# Patient Record
Sex: Female | Born: 1960 | Race: White | Hispanic: Yes | Marital: Married | Smoking: Former smoker
Health system: Southern US, Community
[De-identification: ages and names within clinical notes are randomized; demographics above are authoritative.]

## PROBLEM LIST (undated history)

## (undated) DIAGNOSIS — E785 Hyperlipidemia, unspecified: Secondary | ICD-10-CM

## (undated) DIAGNOSIS — E669 Obesity, unspecified: Secondary | ICD-10-CM

## (undated) HISTORY — DX: Obesity, unspecified: E66.9

## (undated) HISTORY — PX: SHOULDER SURGERY: SHX246

## (undated) HISTORY — DX: Hyperlipidemia, unspecified: E78.5

## (undated) HISTORY — PX: TUBAL LIGATION: SHX77

---

## 1998-08-29 ENCOUNTER — Emergency Department (HOSPITAL_COMMUNITY): Admission: EM | Admit: 1998-08-29 | Discharge: 1998-08-29 | Payer: Self-pay | Admitting: Emergency Medicine

## 1998-08-30 ENCOUNTER — Emergency Department (HOSPITAL_COMMUNITY): Admission: EM | Admit: 1998-08-30 | Discharge: 1998-08-30 | Payer: Self-pay | Admitting: Emergency Medicine

## 1998-09-08 ENCOUNTER — Emergency Department (HOSPITAL_COMMUNITY): Admission: EM | Admit: 1998-09-08 | Discharge: 1998-09-08 | Payer: Self-pay | Admitting: Emergency Medicine

## 1999-05-26 ENCOUNTER — Other Ambulatory Visit: Admission: RE | Admit: 1999-05-26 | Discharge: 1999-05-26 | Payer: Self-pay | Admitting: Obstetrics & Gynecology

## 1999-08-01 ENCOUNTER — Ambulatory Visit (HOSPITAL_COMMUNITY): Admission: RE | Admit: 1999-08-01 | Discharge: 1999-08-01 | Payer: Self-pay | Admitting: Obstetrics and Gynecology

## 1999-10-31 ENCOUNTER — Inpatient Hospital Stay (HOSPITAL_COMMUNITY): Admission: AD | Admit: 1999-10-31 | Discharge: 1999-11-01 | Payer: Self-pay | Admitting: *Deleted

## 1999-10-31 ENCOUNTER — Encounter (INDEPENDENT_AMBULATORY_CARE_PROVIDER_SITE_OTHER): Payer: Self-pay

## 2000-05-04 ENCOUNTER — Inpatient Hospital Stay (HOSPITAL_COMMUNITY): Admission: AD | Admit: 2000-05-04 | Discharge: 2000-05-04 | Payer: Self-pay | Admitting: Obstetrics & Gynecology

## 2001-04-06 ENCOUNTER — Emergency Department (HOSPITAL_COMMUNITY): Admission: EM | Admit: 2001-04-06 | Discharge: 2001-04-06 | Payer: Self-pay | Admitting: Emergency Medicine

## 2001-04-06 ENCOUNTER — Encounter: Payer: Self-pay | Admitting: Emergency Medicine

## 2001-06-03 ENCOUNTER — Other Ambulatory Visit: Admission: RE | Admit: 2001-06-03 | Discharge: 2001-06-03 | Payer: Self-pay | Admitting: Obstetrics and Gynecology

## 2001-06-11 ENCOUNTER — Ambulatory Visit (HOSPITAL_COMMUNITY): Admission: RE | Admit: 2001-06-11 | Discharge: 2001-06-11 | Payer: Self-pay | Admitting: Obstetrics and Gynecology

## 2001-06-11 ENCOUNTER — Encounter: Payer: Self-pay | Admitting: Obstetrics and Gynecology

## 2001-07-27 ENCOUNTER — Ambulatory Visit (HOSPITAL_COMMUNITY): Admission: RE | Admit: 2001-07-27 | Discharge: 2001-07-27 | Payer: Self-pay | Admitting: Obstetrics and Gynecology

## 2002-07-24 ENCOUNTER — Encounter: Payer: Self-pay | Admitting: Emergency Medicine

## 2002-07-24 ENCOUNTER — Emergency Department (HOSPITAL_COMMUNITY): Admission: EM | Admit: 2002-07-24 | Discharge: 2002-07-24 | Payer: Self-pay | Admitting: Emergency Medicine

## 2002-08-18 ENCOUNTER — Encounter: Admission: RE | Admit: 2002-08-18 | Discharge: 2002-08-18 | Payer: Self-pay | Admitting: Specialist

## 2002-08-18 ENCOUNTER — Encounter: Payer: Self-pay | Admitting: Specialist

## 2004-09-20 ENCOUNTER — Other Ambulatory Visit: Admission: RE | Admit: 2004-09-20 | Discharge: 2004-09-20 | Payer: Self-pay | Admitting: Obstetrics and Gynecology

## 2004-09-29 ENCOUNTER — Ambulatory Visit (HOSPITAL_COMMUNITY): Admission: RE | Admit: 2004-09-29 | Discharge: 2004-09-29 | Payer: Self-pay | Admitting: *Deleted

## 2005-04-24 ENCOUNTER — Encounter: Admission: RE | Admit: 2005-04-24 | Discharge: 2005-04-24 | Payer: Self-pay | Admitting: Family Medicine

## 2005-06-30 ENCOUNTER — Encounter: Admission: RE | Admit: 2005-06-30 | Discharge: 2005-06-30 | Payer: Self-pay | Admitting: Family Medicine

## 2005-11-09 ENCOUNTER — Ambulatory Visit: Payer: Self-pay | Admitting: Family Medicine

## 2006-05-04 ENCOUNTER — Ambulatory Visit: Payer: Self-pay | Admitting: Family Medicine

## 2006-07-19 ENCOUNTER — Ambulatory Visit: Payer: Self-pay | Admitting: Family Medicine

## 2006-07-23 ENCOUNTER — Ambulatory Visit: Payer: Self-pay | Admitting: Family Medicine

## 2006-09-05 ENCOUNTER — Emergency Department (HOSPITAL_COMMUNITY): Admission: EM | Admit: 2006-09-05 | Discharge: 2006-09-05 | Payer: Self-pay | Admitting: Emergency Medicine

## 2007-01-28 ENCOUNTER — Ambulatory Visit: Payer: Self-pay | Admitting: Family Medicine

## 2007-04-19 ENCOUNTER — Encounter: Admission: RE | Admit: 2007-04-19 | Discharge: 2007-04-19 | Payer: Self-pay | Admitting: Anesthesiology

## 2007-07-29 ENCOUNTER — Ambulatory Visit: Payer: Self-pay | Admitting: Family Medicine

## 2007-08-06 ENCOUNTER — Emergency Department (HOSPITAL_COMMUNITY): Admission: EM | Admit: 2007-08-06 | Discharge: 2007-08-06 | Payer: Self-pay | Admitting: Emergency Medicine

## 2007-08-07 ENCOUNTER — Emergency Department (HOSPITAL_COMMUNITY): Admission: EM | Admit: 2007-08-07 | Discharge: 2007-08-07 | Payer: Self-pay | Admitting: Emergency Medicine

## 2007-08-13 ENCOUNTER — Ambulatory Visit: Payer: Self-pay | Admitting: Family Medicine

## 2007-09-12 ENCOUNTER — Ambulatory Visit (HOSPITAL_BASED_OUTPATIENT_CLINIC_OR_DEPARTMENT_OTHER): Admission: RE | Admit: 2007-09-12 | Discharge: 2007-09-13 | Payer: Self-pay | Admitting: Orthopedic Surgery

## 2008-02-20 ENCOUNTER — Ambulatory Visit: Payer: Self-pay | Admitting: Family Medicine

## 2008-03-23 ENCOUNTER — Ambulatory Visit: Payer: Self-pay | Admitting: Family Medicine

## 2008-06-01 ENCOUNTER — Ambulatory Visit: Payer: Self-pay | Admitting: Family Medicine

## 2008-06-02 ENCOUNTER — Encounter: Admission: RE | Admit: 2008-06-02 | Discharge: 2008-06-02 | Payer: Self-pay | Admitting: Family Medicine

## 2009-02-16 ENCOUNTER — Ambulatory Visit: Payer: Self-pay | Admitting: Family Medicine

## 2009-02-18 ENCOUNTER — Encounter: Payer: Self-pay | Admitting: Internal Medicine

## 2009-03-01 ENCOUNTER — Other Ambulatory Visit: Admission: RE | Admit: 2009-03-01 | Discharge: 2009-03-01 | Payer: Self-pay | Admitting: Family Medicine

## 2009-03-01 ENCOUNTER — Ambulatory Visit: Payer: Self-pay | Admitting: Family Medicine

## 2009-03-15 ENCOUNTER — Ambulatory Visit: Payer: Self-pay | Admitting: Internal Medicine

## 2009-03-15 DIAGNOSIS — I498 Other specified cardiac arrhythmias: Secondary | ICD-10-CM | POA: Insufficient documentation

## 2009-03-15 DIAGNOSIS — R079 Chest pain, unspecified: Secondary | ICD-10-CM

## 2009-03-15 DIAGNOSIS — E7800 Pure hypercholesterolemia, unspecified: Secondary | ICD-10-CM | POA: Insufficient documentation

## 2009-03-15 DIAGNOSIS — E663 Overweight: Secondary | ICD-10-CM | POA: Insufficient documentation

## 2009-03-15 HISTORY — DX: Chest pain, unspecified: R07.9

## 2009-03-15 HISTORY — DX: Overweight: E66.3

## 2009-03-15 HISTORY — DX: Pure hypercholesterolemia, unspecified: E78.00

## 2009-03-18 ENCOUNTER — Telehealth (INDEPENDENT_AMBULATORY_CARE_PROVIDER_SITE_OTHER): Payer: Self-pay

## 2009-03-19 ENCOUNTER — Encounter: Payer: Self-pay | Admitting: Internal Medicine

## 2009-03-19 ENCOUNTER — Ambulatory Visit: Payer: Self-pay

## 2009-03-22 ENCOUNTER — Ambulatory Visit: Payer: Self-pay | Admitting: Cardiology

## 2009-03-22 ENCOUNTER — Encounter (HOSPITAL_COMMUNITY): Admission: RE | Admit: 2009-03-22 | Discharge: 2009-05-12 | Payer: Self-pay | Admitting: Internal Medicine

## 2009-03-22 ENCOUNTER — Ambulatory Visit: Payer: Self-pay

## 2009-04-12 ENCOUNTER — Encounter
Admission: RE | Admit: 2009-04-12 | Discharge: 2009-04-12 | Payer: Self-pay | Admitting: Physical Medicine and Rehabilitation

## 2009-04-14 ENCOUNTER — Telehealth: Payer: Self-pay | Admitting: Internal Medicine

## 2009-04-19 ENCOUNTER — Ambulatory Visit: Payer: Self-pay | Admitting: Family Medicine

## 2009-04-26 ENCOUNTER — Telehealth (INDEPENDENT_AMBULATORY_CARE_PROVIDER_SITE_OTHER): Payer: Self-pay | Admitting: *Deleted

## 2009-08-19 ENCOUNTER — Telehealth (INDEPENDENT_AMBULATORY_CARE_PROVIDER_SITE_OTHER): Payer: Self-pay | Admitting: *Deleted

## 2009-08-19 ENCOUNTER — Ambulatory Visit: Payer: Self-pay | Admitting: Family Medicine

## 2009-08-20 ENCOUNTER — Ambulatory Visit: Payer: Self-pay | Admitting: Family Medicine

## 2010-04-28 ENCOUNTER — Ambulatory Visit: Payer: Self-pay | Admitting: Family Medicine

## 2010-06-05 ENCOUNTER — Encounter: Payer: Self-pay | Admitting: Family Medicine

## 2010-06-14 NOTE — Progress Notes (Signed)
Summary: Records request from Raphael Gibney. Cathey Endow at [employer] for records received from [employer]. Tresea Mall, Pensions consultant at [employer]. Request forwarded to [employer]. Dena Chavis  August 19, 2009 1:26 PM  Appended Document: Records request from Raphael Gibney. Cathey Endow at [employer] brought paper back to meJonny Ruiz S. Iorio Attorney @ Law)  Dr.McLean is not to sign, I will forward to [place] to see if he wil sign.

## 2010-09-27 NOTE — Op Note (Signed)
Traci Young, Traci Young       ACCOUNT NO.:  [id number]   MEDICAL RECORD NO.:  [id number]          PATIENT TYPE:  AMB   LOCATION:  DSC                          FACILITY:  [facility]   PHYSICIAN:  Katy Fitch. Sypher, M.D. 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  Also retracted supraspinatus and  infraspinatus rotator cuff tear.   OPERATION:  1. Diagnostic arthroscopy, right shoulder followed by arthroscopic      debridement of labrum and synovitis and partial debridement of      subscapularis degenerative tear grade 1.  2. Arthroscopic subacromial decompression with decompression of os      acromionale and removal of osteophytes at nonunion of os      acromionale with partial debridement of distal clavicle preserving      the capsule of the [facility] joint posteriorly, superiorly, and      anteriorly.  3. Anterior-inferior distal clavicle resection.  4. Arthroscopic reconstruction of supraspinatus and infraspinatus      rotator cuff tears.   SURGEON:  Katy Fitch. Sypher, MD   ASSISTANT:  Marveen Reeks. Dasnoit, PA-C.   ANESTHESIA:  General by endotracheal technique supplemented by a right  interscalene block.   SUPERVISING  ANESTHESIOLOGIST:  Quita Skye. Krista Blue, MD   INDICATIONS:  Traci Young is a 46-year woman referred through  the courtesy of Dr. Thyra Breed for evaluation and management of a  right rotator cuff tear.   Clinical examination revealed an os acromionale, AC degenerative change,  and weakness of abduction and external rotation consistent with a  rotator cuff tear.   An MRI of the shoulder documenting the unstable os acromionale, a  chronic retracted tear of the supraspinatus, infraspinatus tendons, and  some degenerative change at the [facility] joint with a prominent anterior-  inferior osteophyte.   Through an interpreter with detailed informed consent, we advised [name] to proceed with arthroscopic evaluation of her shoulder  anticipating decompression of the os acromionale and decompression of  the distal clavicle followed by repair of the rotator cuff with either  arthroscopic or open technique.  After informed consent, she is brought  to the operating room at this time.   PROCEDURE IN DETAIL:  Traci Young was brought to the operating  room and placed in supine position on the operating table.   Following the induction of general anesthesia by endotracheal technique,  she was carefully positioned in a beach-chair position with the aid of a  torso and head holder designed for  shoulder arthroscopy.   The entire right upper extremity and forequarter prepped with DuraPrep  and draped with impervious arthroscopy drapes.   The procedure commenced with placement of the arthroscope through a  standard posterior viewing portal.  Diagnostic arthroscopy revealed a  superior grade 1 subscapularis tear and moderate synovitis.  There was  minimal degenerative changes in the labrum.  The long head biceps had a  stable origin at the superior labrum and the biceps tendon was normal  through the rotator interval.  The entire supraspinatus and  infraspinatus tendons had  avulsed with a greater tuberosity and  recovered by bursa.  The greater tuberosity had reactive bone formation  with a impingement osteophyte.  The teres minor was intact.  The  inferior capsule was otherwise unremarkable and the anterior capsular  ligaments were otherwise unremarkable.   With a spinal needle, the site for debridement of the greater tuberosity  was identified followed by use of the suction shaver debriding the bursa  and a power bur decorticated in the greater tuberosity from the biceps  tendon anteriorly across to the insertion of the teres minor.   The scope was then placed in the subacromial space confirming the nature  of the rotator cuff tear, followed by debridement of the bursa and the  capsule overlying the nonunion of the os acromionale.   The electrocautery was used to release bursa followed by leveling of the  acromion to a type 1 morphology by removal of the osteophytes at the os  acromionale nonunion and generous anterior-medial and anterior-lateral  acromioplasty.  The capsule of the [facility] joint was taken down anteriorly to  allow removal of an anterior-inferior osteophyte at the distal clavicle.  Approximately 25% of the inferior distal clavicle was resected.   After hemostasis was achieved, the tear was carefully examined.  With  the scope placed both in the bursa and in the joint, we placed 2 medial  anchors directly at the articular margin.  With the aid of the standard  Arthrex Scorpion suture passer, a total of 4 mattress sutures were  placed medially to create a proper foot print at the joint margin.  A  technical difficulty was encountered with the Scorpion which was causing  repetitive maceration of the sutures.  Therefore, that particular  Scorpion was rejected and a new one obtained.   Ultimately, satisfactory sutures were placed followed by abduction of  the shoulder inset of the tendon medially, followed by repair with over-  the-top swivel  lock sutures insetting an anatomic repair.   The profile repair was checked both in the bursa and within the joint.  No residual PASTA lesion was noted.   The subscapularis tendon was debrided and found to have minimal grade 1  changes, not requiring repair.   After hemostasis was achieved, the scope was removed from the  subacromial space and the portals repaired with mattress suture of 3-0  nylon.  There were no apparent complications.   Traci Young was awakened from general anesthesia and transferred  to the recovery room with stable signs.  She will be admitted to  recovery care center for observation of vital signs and analgesics in  the form of IV PCA morphine and p.o. and IV Dilaudid as needed.      Katy Fitch Sypher, M.D.  Electronically Signed     RVS/MEDQ  D:  09/12/2007  T:  09/13/2007  Job:  [id number]   cc:   Loraine Leriche L. Vear Clock, M.D.

## 2010-09-30 NOTE — Op Note (Signed)
[facility]  Patient:    Traci Young                       MRN: [id number] Proc. Date: 10/31/99 Adm. Date:  [id number] Disc. Date: [id number] Attending:  Cleatrice Burke Dictator:   Erin Sons, C.N.M.                           Operative Report  PREOPERATIVE DIAGNOSIS:       Intrauterine fetal demise at term.  Meconium stained fluid.  POSTOPERATIVE DIAGNOSIS:      Intrauterine fetal demise at term.  Meconium stained fluid.  OPERATION:                    Normal spontaneous vaginal delivery over an intact perineum.  FINDINGS:                     Nonviable female infant, weight _________, Apgars 0 and 0.  SURGEON:                      Cecilio Asper, M.D.  ASSISTANT:  ANESTHESIA:                   Epidural.  ESTIMATED BLOOD LOSS:  INDICATIONS:                  [name] is a 50 year old, gravida 4, para 3-0-0-3, who was admitted today at 39-5/7 weeks with the diagnosis of intrauterine fetal  demise.  Artificial rupture of membranes was accomplished by Cecilio Asper, M.D. with meconium stained fluid noted.  Pitocin augmentation was begun.  Epidural anesthesia was placed.  DESCRIPTION OF PROCEDURE:     The patient progressed rapidly to completely dilated at 6 p.m. and at 6:22 p.m. delivered precipitously a nonviable female infant. There were no obvious anomalies noted.  There was no evidence of cord entanglements. There was a three vessel cord.  There was minimal skin sloughing and minimal overlapping sutures.  The placenta was spontaneous and intact.  In its delivery, it was meconium stained.  It was sent to pathology.  There was a small vaginal abrasion noted.  No bleeding was noted and no suturing was required.  The fundus was firm.  Estimated blood loss was approximately 100 cc.  Support was offered o the patient and her family through the registered nurse interpretor.  Cecilio Asper, M.D. was en  route for delivery at the time the patient accomplished her birth.  Comfort components of care were implemented by the nursing staff. DD:  10/31/99 TD:  11/02/99 Job: [id number]

## 2010-09-30 NOTE — H&P (Signed)
[facility]  Patient:    Traci Young                       MRN: [id number] Adm. Date:  [id number] Attending:  Cleatrice Burke Dictator:   Erin Sons, C.N.M.                         History and Physical  DATE OF BIRTH:                January 06, 1961  HISTORY OF PRESENT ILLNESS:   Ms. Traci Young is a 50 year old, gravida 4, para 3-0-0-3 at 39-5/7 weeks who presents from the office with a diagnosed IUFD at term.  The patient reports that she has not felt fetal movement since following her last office visit on Thursday of last week.  Pregnancy has remarkable for:                               1. Positive group B strep.                               2. Language barrier.  PRENATAL LABORATORY DATA:     To be dictated separately.  OBSTETRICAL HISTORY:          In 1986, the patient had a vaginal birth of a female infant that weighed 8 pounds at [redacted] weeks gestation.  She was in labor 3 hours.  That child was born in [place].  In 1989 she had a vaginal birth of a female infant that weighed 8 pounds at [redacted] weeks gestation.  She was in labor approximately 1 hours.  She had no complications.  That child was also born in [place].  In 1995 she had a vaginal birth of a female infant, weight 9 pounds at [redacted] weeks gestation.  She was in labor 2 hours and she had no complications.  That child was also born in [place].  PAST MEDICAL HISTORY:         She reports the usual childhood illnesses.  She had a history of some superficial varicosities.  She had a UTI in 1999.  At age 87 or 8 she had a tonsillectomy.  Her only other hospitalization was for childbirth.  HISTORY OF PRESENT PREGNANCY:                    The patient entered care at approximately 16 weeks.  She had an ultrasound at 26 weeks for size greater than dates which showed normal findings.  She had a normal Glucola.  Hemoglobin was 11.8 at 28 weeks.  She had an elevated one-hour GTT.  She had a followup  three-hour GTT that was normal.  FAMILY HISTORY:               Her brother and sister have had heart attacks. Her mother has a heart problem.  Her son has asthma.  Her mother has insulin-dependent diabetes.  Her father and brother are non-insulin dependent diabetics.  Her sister has thyroid problems.  GENETIC HISTORY:              Remarkable for patient being 36 at the time of delivery, the father of the baby being 72 at delivery.  SOCIAL HISTORY:  The patient is married to the father of the baby.  He is involved and supportive.  His name is Kirtland Bouchard.  The patient is Hispanic, from [place].  She is of the [employer].  She as a 9th grade education.  She is unemployed.  Her husband also has a 9th grade education and is employed in distribution services.  She has been followed by the physicians service of [facility].  She denies any alcohol, drug or tobacco use during this pregnancy.   PHYSICAL EXAMINATION:  VITAL SIGNS:                  Vital signs are stable.  The patient is afebrile.  HEENT:                        Within normal limits.  LUNGS:                        Bilateral breath sounds are clear.  HEART:                        Regular rate and rhythm without murmur.  BREASTS:                      Soft and nontender.  ABDOMEN:                      Fundal height is approximately 38 cm.  Estimated fetal weight is 8 to 8-1/2 pounds.  Uterine contractions are irregular and mild.  PELVIC:                       Cervical exam is deferred at present. Ultrasound confirmed IUFD at term.  EXTREMITIES:                  Deep tendon reflexes are 2+ without clonus. There is trace edema noted.  IMPRESSION:                   1. Intrauterine pregnancy with an intrauterine                                  fetal demise at 39-5/7 weeks.                               2. Multiparous.                               3. Positive group B streptococcus.  PLAN:                          1. Admit to birthing suite for                                  consult with Dr. Cleatrice Burke                                  who is attending physician.  2. Routine physician orders.                               3. Dr. Kathryne Sharper will review with the                                  patient and interpreter, plan of care for                                  induction of labor. DD:  10/31/99 TD:  10/31/99 Job: [id number]

## 2010-09-30 NOTE — Discharge Summary (Signed)
[facility]  Patient:    Traci Young                       MRN: [id number] Adm. Date:  [id number] Disc. Date: [id number] Attending:  Cleatrice Burke Dictator:   Miguel Dibble, C.N.M.                           Discharge Summary  DATE OF BIRTH:                15-Jul-1960  PROCEDURES:                   1. Spontaneous vaginal delivery of a nonviable                                  infant.                               2. Pitocin induction of labor.  HOSPITAL COURSE:              On October 31, 1999, the patient presented for routine office visit, stating that she had noticed no fetal movement over the previous week.  She was found to have an intrauterine fetal death.  She is Spanish speaking, and it was explained to her in Spanish that an induction would be required.  The patient and father of the baby desire an autopsy after delivery and prior to funeral services.  An induction was initiated.  She was admitted to the birthing suite at 3 cm, 50% effaced, vertex at -1 to -2. Rupture membranes revealed meconium stained fluid.  By 3244, the patient was breathing through contractions, which were every 4-5 minutes.  Her cervix was 5 cm and 90% effaced with 3 mu of Pitocin infusion.  She received an epidural for pain relief and continued to relax and dilate satisfactorily so that, by 1822, she was able to have a normal spontaneous vaginal delivery over an intact perineum under epidural anesthesia of a female weighing 7 pounds 9 ounces and APGAR of 0 and 0.  Following delivery, there were no apparent signs of anomalies or obvious masses or reasons lending etiology to the cause of death.  On 07-08-2024postpartum day #1, vital signs were satisfactory. Hemoglobin had dropped from 15.9 to 12.5, platelets 272.  The patient was tearful and appropriately grieving.  She received emotional support through a Spanish interpreter and was referred to the comfort team.   She also signed her autopsy papers to allow the autopsy to occur prior to the babys body being transferred to [facility].  She was able to understand her instructions in Spanish and the use of her discharge medications.  She decided on intrauterine device for contraception, that she would return in six weeks to have it placed.  DISCHARGE INSTRUCTIONS:       Per [facility] booklet.  DISCHARGE MEDICATIONS:        Tylenol #3, Motrin, prenatal vitamins.  FOLLOW UP PLAN:               In six weeks at [facility]. DD:  Nov 20, 1999 TD:  11/02/99 Job: [id number]

## 2010-09-30 NOTE — Consult Note (Signed)
[facility]  Patient:    Traci Young, Traci Young                 MRN: [id number] Adm. Date:  [id number] Disc. Date: [id number] Attending:  Leonard Schwartz                          Consultation Report  HISTORY OF PRESENT ILLNESS:   The patient is a 50 year old female, gravida 4, para 4-0-0-3, who presents to the [facility] complaining of bleeding and pain.  She had a Jearld Adjutant IUD put in in November 2001.  She denies fever or chills.  The patient does not speak English, and an interpreter was used to help perform this history and physical examination. The patient denies any prior history of surgery.  She denies any prior history of sexually transmitted diseases.  PAST OBSTETRIC HISTORY:       The patient has had four term deliveries, but one still birth.  DRUG ALLERGIES:               No known drug allergies.  PAST MEDICAL HISTORY:         Contributory.  PHYSICAL EXAMINATION:  VITAL SIGNS:                  Temperature 97.1, pulse 76, respirations 20, blood pressure 111/65.  CHEST:                        Clear.  HEART:                        Regular rate and rhythm.  ABDOMEN:                      Nontender.  PELVIC:                       External genitalia normal.  The vagina is normal except for a small amount of blood.  The cervix is nontender.  The IUD string is present.  The uterus is normal size, shape and consistency. Adnexa show no masses.  LABORATORY DATA:              Urinalysis negative.  Urine pregnancy test is negative.  White blood cell count 7300.  Platelet count 257,000.  Hemoglobin 13.4, hematocrit 37.6%.  Ultrasound performed by Dr. Stefano Gaul showed a normal sized uterus with an IUD present within the uterine cavity.  No adnexal masses are appreciated.  No abnormal fluid collections present.  ASSESSMENT:                   Vaginal spotting and slight pain associated with an IUD.  No signs of infection or  pathology.  PLAN:                         The patient was told to take ibuprofen 600 mg q.6h. p.r.n.  She will call if she is having any other difficulties.  A translator was used and the information was understood according to the patient. DD:  05/04/00 TD:  05/06/00 Job: [id number]

## 2010-10-14 ENCOUNTER — Ambulatory Visit (INDEPENDENT_AMBULATORY_CARE_PROVIDER_SITE_OTHER): Payer: BC Managed Care – PPO | Admitting: Family Medicine

## 2010-10-14 ENCOUNTER — Encounter: Payer: Self-pay | Admitting: Family Medicine

## 2010-10-14 VITALS — BP 130/82 | HR 88 | Temp 98.4°F | Ht 64.0 in | Wt 275.0 lb

## 2010-10-14 DIAGNOSIS — H6690 Otitis media, unspecified, unspecified ear: Secondary | ICD-10-CM

## 2010-10-14 DIAGNOSIS — H6692 Otitis media, unspecified, left ear: Secondary | ICD-10-CM

## 2010-10-14 DIAGNOSIS — J40 Bronchitis, not specified as acute or chronic: Secondary | ICD-10-CM

## 2010-10-14 MED ORDER — AMOXICILLIN 875 MG PO TABS: 875.0000 mg | ORAL_TABLET | Freq: Two times a day (BID) | ORAL | Status: AC

## 2010-10-14 NOTE — Progress Notes (Signed)
  Subjective:    Patient ID: Donald Prose, female    DOB: [birth year], 50 y.o.   MRN: [id number]  HPI She has a five-day history of sore throat, nasal congestion, cough, malaise, some fever and chills, rhinorrhea. She has no allergies and does not smoke    Review of Systems     Objective:   Physical Examalert and in no distress. The left tympanic membrane is slightly dull, right is normal, canals are normal. Throat is clear. Tonsils are normal. Neck is supple without adenopathy or thyromegaly. Cardiac exam shows a regular sinus rhythm without murmurs or gallops. Lungs are clear to auscultation.        Assessment & Plan:  LOM. Bronchitis Treatment with Amoxil. Also recommend NyQuil at night. She will call if not entirely better.

## 2010-10-14 NOTE — Patient Instructions (Addendum)
Take all the antibiotic. use Claritin for the runny nose. use NyQuil for the coughing at night. If you're not totally back to normal when you finish the antibiotics call me

## 2010-11-11 ENCOUNTER — Ambulatory Visit: Payer: BC Managed Care – PPO | Admitting: Medical

## 2010-11-14 ENCOUNTER — Encounter: Payer: Self-pay | Admitting: Family Medicine

## 2010-12-19 ENCOUNTER — Encounter: Payer: Self-pay | Admitting: Family Medicine

## 2010-12-19 ENCOUNTER — Ambulatory Visit (INDEPENDENT_AMBULATORY_CARE_PROVIDER_SITE_OTHER): Payer: BC Managed Care – PPO | Admitting: Family Medicine

## 2010-12-19 VITALS — BP 120/78 | HR 76 | Temp 98.3°F | Wt 179.0 lb

## 2010-12-19 DIAGNOSIS — H8110 Benign paroxysmal vertigo, unspecified ear: Secondary | ICD-10-CM

## 2010-12-19 DIAGNOSIS — M25519 Pain in unspecified shoulder: Secondary | ICD-10-CM

## 2010-12-19 DIAGNOSIS — M25511 Pain in right shoulder: Secondary | ICD-10-CM

## 2010-12-19 MED ORDER — MECLIZINE HCL 12.5 MG PO TABS: 12.5000 mg | ORAL_TABLET | Freq: Three times a day (TID) | ORAL | Status: DC | PRN

## 2010-12-19 NOTE — Patient Instructions (Signed)
Use the medicine for the dizziness as needed. If you're still having difficulty in one week, return here. You may have the surgery done in [facility] or with a local physician; whichever you prefer

## 2010-12-19 NOTE — Progress Notes (Signed)
  Subjective:    Patient ID: Traci Young, female    DOB: [birth year], 50 y.o.   MRN: [id number]  HPI She complains of a several day history of dizziness with any position but no blurred vision, double vision is, sinus congestion or PND. No heart rate changes. She also continues to have difficulty with right shoulder pain. It was difficult to get a good history but apparently she has had surgery and has had 3 consultations. She did have an MRI done and surgery was recommended.   Review of Systems     Objective:   Physical Exam alert and in no distress. Tympanic membranes and canals are normal. Throat is clear. Tonsils are normal. Neck is supple without adenopathy or thyromegaly. Cardiac exam shows a regular sinus rhythm without murmurs or gallops. Lungs are clear to auscultation. EOMI.        Assessment & Plan:  Benign positional vertigo. Right shoulder pain. I will treat with Antivert. If continued difficulty they're to return here in one week. Also recommended that she go back to the surgeon she saw in [place] since he is one of did the MRI and potentially have surgery done there as opposed to seeing another Careers adviser.

## 2011-03-14 ENCOUNTER — Ambulatory Visit (INDEPENDENT_AMBULATORY_CARE_PROVIDER_SITE_OTHER): Payer: BC Managed Care – PPO | Admitting: Family Medicine

## 2011-03-14 ENCOUNTER — Encounter: Payer: Self-pay | Admitting: Family Medicine

## 2011-03-14 VITALS — BP 130/80 | HR 66 | Temp 97.8°F | Wt 180.0 lb

## 2011-03-14 DIAGNOSIS — R430 Anosmia: Secondary | ICD-10-CM

## 2011-03-14 DIAGNOSIS — R439 Unspecified disturbances of smell and taste: Secondary | ICD-10-CM

## 2011-03-14 DIAGNOSIS — Z23 Encounter for immunization: Secondary | ICD-10-CM

## 2011-03-14 DIAGNOSIS — J019 Acute sinusitis, unspecified: Secondary | ICD-10-CM

## 2011-03-14 MED ORDER — AMOXICILLIN 875 MG PO TABS: 875.0000 mg | ORAL_TABLET | Freq: Two times a day (BID) | ORAL | Status: AC

## 2011-03-14 NOTE — Progress Notes (Signed)
  Subjective:    Patient ID: Traci Young, female    DOB: [birth year], 50 y.o.   MRN: [id number]  HPI She is here for evaluation of difficulty with lack of smell and taste in her mouth for the last several months. She was treated approximately 2 or 3 months ago for sinusitis did help with her symptoms. No cough, rhinorrhea, sore throat, fever or chills. She is a previous smoker   Review of Systems     Objective:   Physical Exam alert and in no distress. Tympanic membranes and canals are normal. Throat is clear. Tonsils are normal. Neck is supple without adenopathy or thyromegaly. Cardiac exam shows a regular sinus rhythm without murmurs or gallops. Lungs are clear to auscultation. Nasal mucosa is red and swollen with tenderness over all sinuses       Assessment & Plan:  Sinusitis. Anosmia. I will treat her with Amoxil. She is to call me if not entirely better Flu shot given

## 2011-03-14 NOTE — Patient Instructions (Signed)
Take all the antibiotic and if not totally back to normal ,call me.

## 2011-08-14 ENCOUNTER — Encounter: Payer: Self-pay | Admitting: Family Medicine

## 2011-08-14 ENCOUNTER — Ambulatory Visit (INDEPENDENT_AMBULATORY_CARE_PROVIDER_SITE_OTHER): Payer: BC Managed Care – PPO | Admitting: Family Medicine

## 2011-08-14 VITALS — BP 120/70 | HR 65 | Wt 182.0 lb

## 2011-08-14 DIAGNOSIS — J019 Acute sinusitis, unspecified: Secondary | ICD-10-CM

## 2011-08-14 MED ORDER — AMOXICILLIN-POT CLAVULANATE 875-125 MG PO TABS: 1.0000 | ORAL_TABLET | Freq: Two times a day (BID) | ORAL | Status: AC

## 2011-08-14 NOTE — Progress Notes (Signed)
  Subjective:    Patient ID: Traci Young, female    DOB: May 30, 1960, 51 y.o.   MRN: [id number]  HPI She complains of a several month history of having to clear her throat as well as having nasal congestion. Her son is here as a Nurse, learning disability. She has no fever, chills, earache. She did smoke but quit 15 years ago.   Review of Systems     Objective:   Physical Exam alert and in no distress. Tympanic membranes and canals are normal. Throat is clear. Tonsils are normal. Neck is supple without adenopathy or thyromegaly. Cardiac exam shows a regular sinus rhythm without murmurs or gallops. Lungs are clear to auscultation. Nasal mucosa is red. Questionable tenderness over her sinuses.       Assessment & Plan:   1. Sinusitis acute  amoxicillin-clavulanate (AUGMENTIN) 875-125 MG per tablet   she is to call me in 2 weeks to let him know how she is feeling.

## 2011-08-14 NOTE — Patient Instructions (Signed)
Take all of the antibiotic and call me in 2 weeks to let him know how you're doing

## 2012-07-12 ENCOUNTER — Emergency Department (HOSPITAL_COMMUNITY): Payer: 59

## 2012-07-12 ENCOUNTER — Emergency Department (HOSPITAL_COMMUNITY)
Admission: EM | Admit: 2012-07-12 | Discharge: 2012-07-12 | Disposition: A | Discharge status: Home / Self Care | Payer: 59 | Attending: Emergency Medicine | Admitting: Emergency Medicine

## 2012-07-12 ENCOUNTER — Encounter (HOSPITAL_COMMUNITY): Payer: Self-pay

## 2012-07-12 DIAGNOSIS — E669 Obesity, unspecified: Secondary | ICD-10-CM | POA: Insufficient documentation

## 2012-07-12 DIAGNOSIS — Y9289 Other specified places as the place of occurrence of the external cause: Secondary | ICD-10-CM | POA: Insufficient documentation

## 2012-07-12 DIAGNOSIS — Y9302 Activity, running: Secondary | ICD-10-CM | POA: Insufficient documentation

## 2012-07-12 DIAGNOSIS — Z87891 Personal history of nicotine dependence: Secondary | ICD-10-CM | POA: Insufficient documentation

## 2012-07-12 DIAGNOSIS — S93409A Sprain of unspecified ligament of unspecified ankle, initial encounter: Secondary | ICD-10-CM | POA: Insufficient documentation

## 2012-07-12 DIAGNOSIS — Z862 Personal history of diseases of the blood and blood-forming organs and certain disorders involving the immune mechanism: Secondary | ICD-10-CM | POA: Insufficient documentation

## 2012-07-12 DIAGNOSIS — S93402A Sprain of unspecified ligament of left ankle, initial encounter: Secondary | ICD-10-CM

## 2012-07-12 DIAGNOSIS — Z8639 Personal history of other endocrine, nutritional and metabolic disease: Secondary | ICD-10-CM | POA: Insufficient documentation

## 2012-07-12 DIAGNOSIS — X500XXA Overexertion from strenuous movement or load, initial encounter: Secondary | ICD-10-CM | POA: Insufficient documentation

## 2012-07-12 MED ORDER — OXYCODONE-ACETAMINOPHEN 5-325 MG PO TABS: 2.0000 | ORAL_TABLET | ORAL | Status: DC | PRN

## 2012-07-12 MED ORDER — OXYCODONE-ACETAMINOPHEN 5-325 MG PO TABS
2.0000 | ORAL_TABLET | Freq: Once | ORAL | Status: AC
  Administered 2012-07-12: 2 via ORAL
  Filled 2012-07-12: qty 2

## 2012-07-12 NOTE — Progress Notes (Signed)
Orthopedic Tech Progress Note Patient Details:  Traci Young 08/16/1960 [id number]  Ortho Devices Type of Ortho Device: ASO;Crutches Ortho Device/Splint Location: LEFT ASO AND CRUTCHES Ortho Device/Splint Interventions: Application   [name] 07/12/2012, 11:28 AM

## 2012-07-12 NOTE — ED Notes (Signed)
Pt moved to stretcher, foot elevated.

## 2012-07-12 NOTE — ED Provider Notes (Signed)
History     CSN: [id number]  Arrival date & time 07/12/12  0932   First MD Initiated Contact with Patient 07/12/12 1001      Chief Complaint  Patient presents with  . Ankle Pain    (Consider location/radiation/quality/duration/timing/severity/associated sxs/prior treatment) HPI Comments: Traci Young is a 52 y.o. female who presents for nonradiating L ankle pain x 2 days. Patient was running [place] some trails in the woods yesterday for exercise when she twisted her ankle. Patient admits to weight bearing x 2 days, but this makes the pain in her ankle worse. Pain lessens when not weight bearing. Patient admits to ankle swelling and denies LOC, numbness/tingling in LLE and redness or warmth of the affected area. Denies fevers.  Patient is a 52 y.o. female presenting with ankle pain. The history is provided by the patient. A language interpreter was used (Son translated).  Ankle Pain Location:  Ankle Time since incident:  2 days Ankle location:  L ankle Pain details:    Quality:  Aching   Radiates to:  Does not radiate   Severity:  Mild   Progression:  Unchanged Chronicity:  New Relieved by:  Immobilization and rest Worsened by:  Bearing weight Associated symptoms: swelling   Associated symptoms: no back pain, no fever, no muscle weakness, no numbness and no tingling     Past Medical History  Diagnosis Date  . Obesity   . Elevated lipids     History reviewed. No pertinent past surgical history.  History reviewed. No pertinent family history.  History  Substance Use Topics  . Smoking status: Former Games developer  . Smokeless tobacco: Never Used  . Alcohol Use: No    OB History   Grav Para Term Preterm Abortions TAB SAB Ect Mult Living                  Review of Systems  Constitutional: Negative for fever.  Respiratory: Negative for shortness of breath.   Cardiovascular: Negative for chest pain.  Musculoskeletal: Positive for joint swelling. Negative for  back pain.  Skin: Negative for color change and wound.  Neurological: Negative for weakness and numbness.  All other systems reviewed and are negative.    Allergies  Review of patient's allergies indicates no known allergies.  Home Medications   Current Outpatient Rx  Name  Route  Sig  Dispense  Refill  . oxyCODONE-acetaminophen (PERCOCET/ROXICET) 5-325 MG per tablet   Oral   Take 2 tablets by mouth every 4 (four) hours as needed for pain.   10 tablet   0     BP 127/69  Pulse 83  Temp(Src) 98 F (36.7 C) (Oral)  Resp 16  SpO2 98%  LMP 06/17/2012  Physical Exam  Nursing note and vitals reviewed. Constitutional: She is oriented to person, place, and time. She appears well-developed and well-nourished. No distress.  HENT:  Head: Normocephalic and atraumatic.  Eyes: Conjunctivae are normal. No scleral icterus.  Neck: Normal range of motion.  Cardiovascular: Intact distal pulses.   Pulmonary/Chest: Effort normal.  Musculoskeletal:       Left ankle: She exhibits decreased range of motion and swelling. She exhibits no ecchymosis, no deformity, no laceration and normal pulse. Tenderness. Lateral malleolus and medial malleolus tenderness found.       Left foot: Normal. She exhibits normal range of motion, no tenderness, no bony tenderness, no swelling, normal capillary refill, no crepitus, no deformity and no laceration.  Capillary refill in LLE < 2  seconds  Neurological: She is alert and oriented to person, place, and time. No sensory deficit. She exhibits normal muscle tone.  Skin: No rash noted. She is not diaphoretic. No erythema. No pallor.  Psychiatric: She has a normal mood and affect. Her behavior is normal.    ED Course  Procedures (including critical care time)  Labs Reviewed - No data to display Dg Ankle Complete Left  07/12/2012  *RADIOLOGY REPORT*  Clinical Data: Left ankle pain and swelling secondary to a fall.  LEFT ANKLE COMPLETE - 3+ VIEW  Comparison:  None.  Findings: Fracture or dislocation.  Small ankle joint effusion. Soft tissues swelling around the ankle primarily lateral.  Annual spur.  IMPRESSION: No acute osseous abnormality.  Small ankle effusion.  Soft tissue swelling.   Original Report Authenticated By: Francene Boyers, M.D.      1. Ankle sprain, left, initial encounter      MDM  Traci Young is a 52 y.o. female who presents for L ankle pain and swelling after twisting her ankle yesterday while running on a forest trail. DG L ankle without evidence of fracture or dislocation. Positive soft tissue swelling. Patient will be treated for uncomplicated ankle sprain with RICE and d/c follow up with PCP. Patient given ASO ankle and advised to take ibuprofen for the inflammation. Script for percocet given to take as needed for pain. Patient verbalizes comfort and understanding of diagnosis and plan.  Filed Vitals:   07/12/12 0948  BP: 127/69  Pulse: 83  Temp: 98 F (36.7 C)  TempSrc: Oral  Resp: 16  SpO2: 98%          Antony Madura, PA-C 07/15/12 1436

## 2012-07-12 NOTE — ED Notes (Signed)
Pt c/o (L) ankle pain, pt twisted her ankle yesterday while running

## 2012-07-17 NOTE — ED Provider Notes (Signed)
Medical screening examination/treatment/procedure(s) were performed by non-physician practitioner and as supervising physician I was immediately available for consultation/collaboration.  Gilda Crease, MD 07/17/12 [phone]

## 2015-07-20 ENCOUNTER — Ambulatory Visit (INDEPENDENT_AMBULATORY_CARE_PROVIDER_SITE_OTHER): Payer: BLUE CROSS/BLUE SHIELD | Admitting: Family Medicine

## 2015-07-20 ENCOUNTER — Ambulatory Visit
Admission: RE | Admit: 2015-07-20 | Discharge: 2015-07-20 | Disposition: A | Discharge status: Home / Self Care | Payer: BLUE CROSS/BLUE SHIELD | Source: Ambulatory Visit | Attending: Family Medicine | Admitting: Family Medicine

## 2015-07-20 ENCOUNTER — Encounter: Payer: Self-pay | Admitting: Family Medicine

## 2015-07-20 VITALS — BP 122/84 | HR 102 | Temp 99.1°F | Resp 16 | Wt 185.0 lb

## 2015-07-20 DIAGNOSIS — J029 Acute pharyngitis, unspecified: Secondary | ICD-10-CM | POA: Diagnosis not present

## 2015-07-20 DIAGNOSIS — R05 Cough: Secondary | ICD-10-CM

## 2015-07-20 DIAGNOSIS — R509 Fever, unspecified: Secondary | ICD-10-CM | POA: Diagnosis not present

## 2015-07-20 DIAGNOSIS — R058 Other specified cough: Secondary | ICD-10-CM

## 2015-07-20 LAB — CBC WITH DIFFERENTIAL/PLATELET
BASOS ABS: 0.1 10*3/uL (ref 0.0–0.1)
Basophils Relative: 1 % (ref 0–1)
EOS ABS: 0.2 10*3/uL (ref 0.0–0.7)
Eosinophils Relative: 3 % (ref 0–5)
HCT: 43 % (ref 36.0–46.0)
Hemoglobin: 15.4 g/dL — ABNORMAL HIGH (ref 12.0–15.0)
LYMPHS ABS: 1.2 10*3/uL (ref 0.7–4.0)
LYMPHS PCT: 22 % (ref 12–46)
MCH: 30.1 pg (ref 26.0–34.0)
MCHC: 35.8 g/dL (ref 30.0–36.0)
MCV: 84.1 fL (ref 78.0–100.0)
MPV: 8.5 fL — AB (ref 8.6–12.4)
Monocytes Absolute: 0.5 10*3/uL (ref 0.1–1.0)
Monocytes Relative: 9 % (ref 3–12)
NEUTROS ABS: 3.4 10*3/uL (ref 1.7–7.7)
NEUTROS PCT: 65 % (ref 43–77)
PLATELETS: 248 10*3/uL (ref 150–400)
RBC: 5.11 MIL/uL (ref 3.87–5.11)
RDW: 14.1 % (ref 11.5–15.5)
WBC: 5.3 10*3/uL (ref 4.0–10.5)

## 2015-07-20 LAB — POC INFLUENZA A&B (BINAX/QUICKVUE)
INFLUENZA A, POC: NEGATIVE
INFLUENZA B, POC: NEGATIVE

## 2015-07-20 LAB — POCT RAPID STREP A (OFFICE): Rapid Strep A Screen: NEGATIVE

## 2015-07-20 NOTE — Patient Instructions (Signed)
Try salt water gargles (1/2 teaspoon of salt in 6 ounces of water), Tylenol or ibuprofen for fever, pain and Mucinex DM or Delsym for cough.  We will call tomorrow with results. If your symptoms are not improving or if you get worse call or return.   Tos en los adultos (Cough, Adult) La tos es un reflejo que limpia la garganta y las vas respiratorias, y ayuda a la curacin y Training and development officerla proteccin de los pulmones. Es normal toser de Teacher, English as a foreign languagevez en cuando, pero cuando esta se presenta con otros sntomas o dura mucho tiempo puede ser el signo de una enfermedad que Kanarravillenecesita tratamiento. La tos puede durar solo 2 o 3semanas (aguda) o ms de 8semanas (crnica). CAUSAS Comnmente, las causas de la tos son las siguientes:  Visual merchandisernhalar sustancias que Sealed Air Corporationirritan los pulmones.  Una infeccin respiratoria viral o bacteriana.  Alergias.  Asma.  Goteo posnasal.  Fumar.  El retroceso de cido estomacal hacia el esfago (reflujo gastroesofgico).  Algunos medicamentos.  Los problemas pulmonares crnicos, entre ellos, la enfermedad pulmonar obstructiva crnica (EPOC) (o, en contadas ocasiones, el cncer de pulmn).  Otras afecciones, como la insuficiencia cardaca. INSTRUCCIONES PARA EL CUIDADO EN EL HOGAR  Est atento a cualquier cambio en los sntomas. Tome estas medidas para Paramedicaliviar las molestias:  Tome los medicamentos solamente como se lo haya indicado el mdico.  Si le recetaron un antibitico, tmelo como se lo haya indicado el mdico. No deje de tomar los antibiticos aunque comience a sentirse mejor.  Hable con el mdico antes de tomar un antitusivo.  Beba suficiente lquido para Photographermantener la orina clara o de color amarillo plido.  Si el aire est seco, use un vaporizador o un humidificador con vapor fro en su habitacin o en su casa para ayudar a aflojar las secreciones.  Evite todas las cosas que le producen tos en el trabajo o en su casa.  Si la tos aumenta durante la noche, intente dormir  semisentado.  Evite el humo del cigarrillo. Si fuma, deje de hacerlo. Si necesita ayuda para dejar de fumar, consulte al mdico.  Evite la cafena.  Evite el alcohol.  Descanse todo lo que sea necesario. SOLICITE ATENCIN MDICA SI:   Aparecen nuevos sntomas.  Expectora pus al toser.  La tos no mejora despus de 2 o 3semanas, o empeora.  No puede controlar la tos con antitusivos y no puede dormir bien.  Tiene un dolor que se intensifica o que no puede Sales promotion account executivecontrolar con analgsicos.  Tiene fiebre.  Baja de peso sin causa aparente.  Tiene transpiracin nocturna. SOLICITE ATENCIN MDICA DE INMEDIATO SI:  Tose y escupe sangre.  Tiene dificultad para respirar.  Los latidos cardacos son muy rpidos.   Esta informacin no tiene Theme park managercomo fin reemplazar el consejo del mdico. Asegrese de hacerle al mdico cualquier pregunta que tenga.   Document Released: 12/07/2010 Document Revised: 01/20/2015 Elsevier Interactive Patient Education [employer]2016 Elsevier Inc.

## 2015-07-20 NOTE — Progress Notes (Signed)
Subjective:  Traci Young is a 55 y.o. female who presents with a translator for evaluation of a 2 day history of productive cough, nasal congestion, fever, and sore throat.  She has not had a recent close exposure to someone with proven streptococcal pharyngitis.  Associated symptoms include right upper back pain for 2 weeks.  Denies ear pain, nausea, vomiting, diarrhea.  Former smoker.   Treatment to date: Nyquil. Positive sick contacts- husband with flu.  No other aggravating or relieving factors.  No other c/o.  The following portions of the patient's history were reviewed and updated as appropriate: allergies, current medications, past medical history, past social history, past surgical history and problem list.  ROS as in subjective   Objective: Filed Vitals:   07/20/15 1408  BP: 122/84  Pulse: 102  Temp: 99.1 F (37.3 C)  Resp: 16    General appearance: no distress, WD/WN, mildly ill-appearing HEENT: normocephalic, conjunctiva/corneas normal, sclerae anicteric, nares patent, clear discharge and erythema, pharynx with erythema, without exudate.  Oral cavity: MMM, no lesions  Neck: supple, no lymphadenopathy, no thyromegaly Heart: RRR, normal S1, S2, no murmurs Lungs: right upper lobe +rhonchi, no wheezes, other lobes are CTA.     Laboratory Strep test done. Results:negative.   Flu swab done and negative.  Assessment and Plan: Acute pharyngitis, unspecified etiology - Plan: POCT rapid strep A, DG Chest 2 View, CBC with Differential/Platelet  Cough with sputum - Plan: DG Chest 2 View  Fever, unspecified - Plan: DG Chest 2 View, CBC with Differential/Platelet   Advised that symptoms and exam suggest a potential bacterial etiology vs viral until pneumonia ruled out by chest XR. Discussed that she was negative for strep and flu.  Discussed symptomatic treatment including salt water gargles, warm fluids, rest, hydrate well, can use over-the-counter Tylenol or  ibuprofen for throat pain, fever, or malaise. If worse or not improving within 2-3 days, call or return.

## 2015-08-11 ENCOUNTER — Encounter: Payer: Self-pay | Admitting: Family Medicine

## 2015-08-11 ENCOUNTER — Ambulatory Visit (INDEPENDENT_AMBULATORY_CARE_PROVIDER_SITE_OTHER): Payer: BLUE CROSS/BLUE SHIELD | Admitting: Family Medicine

## 2015-08-11 VITALS — BP 120/80 | HR 80 | Temp 98.1°F | Wt 190.4 lb

## 2015-08-11 DIAGNOSIS — J029 Acute pharyngitis, unspecified: Secondary | ICD-10-CM

## 2015-08-11 LAB — POCT RAPID STREP A (OFFICE): Rapid Strep A Screen: NEGATIVE

## 2015-08-11 NOTE — Progress Notes (Signed)
Subjective:  Traci Young is a 55 y.o. female who presents for evaluation of sore throat and dry cough for 4 days.  She has not had a recent close exposure to someone with proven streptococcal pharyngitis.  Denies fever, body aches, headache, ear pain, chest pain, shortness of breath, nausea, vomiting, diarrhea.   Treatment to date: none.  ? sick contacts.  No other aggravating or relieving factors.  No does not smoke. No recent antibiotic use.   The following portions of the patient's history were reviewed and updated as appropriate: allergies, current medications, past medical history, past social history, past surgical history and problem list.  ROS as in subjective   Objective: Filed Vitals:   08/11/15 1555  BP: 120/80  Pulse: 80  Temp: 98.1 F (36.7 C)    General appearance: no distress, WD/WN, is not ill-appearing HEENT: normocephalic, conjunctiva/corneas normal, sclerae anicteric, nares patent, no discharge or erythema, pharynx with erythema, without exudate.  Oral cavity: MMM, no lesions  Neck: supple, no lymphadenopathy, no thyromegaly Heart: RRR, normal S1, S2, no murmurs Lungs: CTA bilaterally, no wheezes, rhonchi, or rales    Laboratory Strep test done. Results:negative.    Assessment and Plan:  Advised that symptoms and exam suggest a viral etiology.  Discussed symptomatic treatment including salt water gargles, warm fluids, rest, hydrate well, can use over-the-counter Tylenol or Ibuprofen for throat pain, fever, or malaise. If worse or not improving within 2-3 days, call or return.

## 2015-08-11 NOTE — Patient Instructions (Signed)
Use salt water gargles and stay well hydrated. Take Tylenol or ibuprofen for discomfort.   Faringitis (Pharyngitis) La faringitis ocurre cuando la faringe presenta enrojecimiento, dolor e hinchazn (inflamacin).  CAUSAS  Normalmente, la faringitis se debe a una infeccin. Generalmente, estas infecciones ocurren debido a virus (viral) y se presentan cuando las personas se resfran. Sin embargo, a Advertising account executiveveces la faringitis es provocada por bacterias (bacteriana). Las alergias tambin pueden ser una causa de la faringitis. La faringitis viral se puede contagiar de Neomia Dearuna persona a otra al toser, estornudar y compartir objetos o utensilios personales (tazas, tenedores, cucharas, cepillos de diente). La faringitis bacteriana se puede contagiar de Neomia Dearuna persona a otra a travs de un contacto ms ntimo, como besar.  SIGNOS Y SNTOMAS  Los sntomas de la faringitis incluyen los siguientes:   Dolor de Advertising copywritergarganta.  Cansancio (fatiga).  Fiebre no muy elevada.  Dolor de Turkmenistancabeza.  Dolores musculares y en las articulaciones.  Erupciones cutneas  Ganglios linfticos hinchados.  Una pelcula parecida a las placas en la garganta o las amgdalas (frecuente con la faringitis bacteriana). DIAGNSTICO  El mdico le har preguntas sobre la enfermedad y sus sntomas. Normalmente, todo lo que se necesita para diagnosticar una faringitis son sus antecedentes mdicos y un examen fsico. A veces se realiza una prueba rpida para estreptococos. Tambin es posible que se realicen otros anlisis de laboratorio, segn la posible causa.  TRATAMIENTO  La faringitis viral normalmente mejorar en un plazo de 3 a 4das sin medicamentos. La faringitis bacteriana se trata con medicamentos que McGraw-Hillmatan los grmenes (antibiticos).  INSTRUCCIONES PARA EL CUIDADO EN EL HOGAR   Beba gran cantidad de lquido para mantener la orina de tono claro o color amarillo plido.  Tome solo medicamentos de venta libre o recetados, segn las  indicaciones del mdico.  Si le receta antibiticos, asegrese de terminarlos, incluso si comienza a Actorsentirse mejor.  No tome aspirina.  Descanse lo suficiente.  Hgase grgaras con 8onzas (227ml) de agua con sal (cucharadita de sal por litro de agua) cada 1 o 2horas para Science writercalmar la garganta.  Puede usar pastillas (si no corre riesgo de Health visitorahogarse) o aerosoles para Science writercalmar la garganta. SOLICITE ATENCIN MDICA SI:   Tiene bultos grandes y dolorosos en el cuello.  Tiene una erupcin cutnea.  Cuando tose elimina una expectoracin verde, amarillo amarronado o con [place]. SOLICITE ATENCIN MDICA DE INMEDIATO SI:   El cuello se pone rgido.  Comienza a babear o no puede tragar lquidos.  Vomita o no puede retener los American International Groupmedicamentos ni los lquidos.  Siente un dolor intenso que no se alivia con los medicamentos recomendados.  Tiene dificultades para respirar (y no debido a la nariz tapada). ASEGRESE DE QUE:   Comprende estas instrucciones.  Controlar su afeccin.  Recibir ayuda de inmediato si no mejora o si empeora.   Esta informacin no tiene Theme park managercomo fin reemplazar el consejo del mdico. Asegrese de hacerle al mdico cualquier pregunta que tenga.   Document Released: 02/08/2005 Document Revised: 02/19/2013 Elsevier Interactive Patient Education [employer]2016 Elsevier Inc.

## 2016-04-25 ENCOUNTER — Other Ambulatory Visit: Payer: Self-pay | Admitting: Family Medicine

## 2016-04-25 ENCOUNTER — Encounter: Payer: Self-pay | Admitting: Family Medicine

## 2016-04-25 ENCOUNTER — Other Ambulatory Visit (HOSPITAL_COMMUNITY)
Admission: RE | Admit: 2016-04-25 | Discharge: 2016-04-25 | Disposition: A | Discharge status: Home / Self Care | Payer: BLUE CROSS/BLUE SHIELD | Source: Ambulatory Visit | Attending: Family Medicine | Admitting: Family Medicine

## 2016-04-25 ENCOUNTER — Ambulatory Visit (INDEPENDENT_AMBULATORY_CARE_PROVIDER_SITE_OTHER): Payer: BLUE CROSS/BLUE SHIELD | Admitting: Family Medicine

## 2016-04-25 VITALS — BP 130/82 | HR 62 | Ht 62.25 in | Wt 187.4 lb

## 2016-04-25 DIAGNOSIS — Z1211 Encounter for screening for malignant neoplasm of colon: Secondary | ICD-10-CM | POA: Diagnosis not present

## 2016-04-25 DIAGNOSIS — Z1159 Encounter for screening for other viral diseases: Secondary | ICD-10-CM | POA: Diagnosis not present

## 2016-04-25 DIAGNOSIS — Z01419 Encounter for gynecological examination (general) (routine) without abnormal findings: Secondary | ICD-10-CM | POA: Diagnosis present

## 2016-04-25 DIAGNOSIS — Z1231 Encounter for screening mammogram for malignant neoplasm of breast: Secondary | ICD-10-CM

## 2016-04-25 DIAGNOSIS — I8393 Asymptomatic varicose veins of bilateral lower extremities: Secondary | ICD-10-CM

## 2016-04-25 DIAGNOSIS — M79604 Pain in right leg: Secondary | ICD-10-CM

## 2016-04-25 DIAGNOSIS — E669 Obesity, unspecified: Secondary | ICD-10-CM

## 2016-04-25 DIAGNOSIS — Z1151 Encounter for screening for human papillomavirus (HPV): Secondary | ICD-10-CM | POA: Diagnosis not present

## 2016-04-25 DIAGNOSIS — Z8639 Personal history of other endocrine, nutritional and metabolic disease: Secondary | ICD-10-CM | POA: Diagnosis not present

## 2016-04-25 DIAGNOSIS — Z124 Encounter for screening for malignant neoplasm of cervix: Secondary | ICD-10-CM

## 2016-04-25 DIAGNOSIS — Z0000 Encounter for general adult medical examination without abnormal findings: Secondary | ICD-10-CM

## 2016-04-25 DIAGNOSIS — Z113 Encounter for screening for infections with a predominantly sexual mode of transmission: Secondary | ICD-10-CM

## 2016-04-25 DIAGNOSIS — Z1239 Encounter for other screening for malignant neoplasm of breast: Secondary | ICD-10-CM

## 2016-04-25 LAB — POCT URINALYSIS DIPSTICK
Bilirubin, UA: NEGATIVE
Blood, UA: NEGATIVE
Glucose, UA: NEGATIVE
Ketones, UA: NEGATIVE
LEUKOCYTES UA: NEGATIVE
NITRITE UA: NEGATIVE
PH UA: 5.5
PROTEIN UA: NEGATIVE
Spec Grav, UA: 1.015
Urobilinogen, UA: NEGATIVE

## 2016-04-25 LAB — CBC WITH DIFFERENTIAL/PLATELET
BASOS PCT: 0 %
Basophils Absolute: 0 cells/uL (ref 0–200)
EOS ABS: 204 {cells}/uL (ref 15–500)
Eosinophils Relative: 3 %
HEMATOCRIT: 43.6 % (ref 35.0–45.0)
Hemoglobin: 14.9 g/dL (ref 11.7–15.5)
LYMPHS PCT: 19 %
Lymphs Abs: 1292 cells/uL (ref 850–3900)
MCH: 29.4 pg (ref 27.0–33.0)
MCHC: 34.2 g/dL (ref 32.0–36.0)
MCV: 86 fL (ref 80.0–100.0)
MONO ABS: 408 {cells}/uL (ref 200–950)
MPV: 8.9 fL (ref 7.5–12.5)
Monocytes Relative: 6 %
NEUTROS ABS: 4896 {cells}/uL (ref 1500–7800)
Neutrophils Relative %: 72 %
Platelets: 290 10*3/uL (ref 140–400)
RBC: 5.07 MIL/uL (ref 3.80–5.10)
RDW: 14.4 % (ref 11.0–15.0)
WBC: 6.8 10*3/uL (ref 4.0–10.5)

## 2016-04-25 LAB — TSH: TSH: 1.9 m[IU]/L

## 2016-04-25 NOTE — Patient Instructions (Signed)
Call and schedule your mammogram appointment at your convenience. The gastroenterologist office will call you to schedule an appointment to discuss colonoscopy. The vein specialist will call you to schedule an appointment to see you for leg pain and varicose veins  I recommend eating a healthy well balanced diet and reducing the amount of fat, carbohydrates and sugar. It is recommended that you get at least 150 minutes of physical activity per week.  We will call you with lab results in the next 1-2 days.  Preventative Care for Adults - Female      MAINTAIN REGULAR HEALTH EXAMS:  A routine yearly physical is a good way to check in with your primary care provider about your health and preventive screening. It is also an opportunity to share updates about your health and any concerns you have, and receive a thorough all-over exam.   Most health insurance companies pay for at least some preventative services.  Check with your health plan for specific coverages.  WHAT PREVENTATIVE SERVICES DO WOMEN NEED?  Adult women should have their weight and blood pressure checked regularly.   Women age 54 and older should have their cholesterol levels checked regularly.  Women should be screened for cervical cancer with a Pap smear and pelvic exam beginning at either age 54, or 3 years after they become sexually activity.    Breast cancer screening generally begins at age 440 with a mammogram and breast exam by your primary care provider.    Beginning at age 54 and continuing to age 54, women should be screened for colorectal cancer.  Certain people may need continued testing until age 54.  Updating vaccinations is part of preventative care.  Vaccinations help protect against diseases such as the flu.  Osteoporosis is a disease in which the bones lose minerals and strength as we age. Women ages 5865 and over should discuss this with their caregivers, as should women after menopause who have other risk  factors.  Lab tests are generally done as part of preventative care to screen for anemia and blood disorders, to screen for problems with the kidneys and liver, to screen for bladder problems, to check blood sugar, and to check your cholesterol level.  Preventative services generally include counseling about diet, exercise, avoiding tobacco, drugs, excessive alcohol consumption, and sexually transmitted infections.    GENERAL RECOMMENDATIONS FOR GOOD HEALTH:  Healthy diet:  Eat a variety of foods, including fruit, vegetables, animal or vegetable protein, such as meat, fish, chicken, and eggs, or beans, lentils, tofu, and grains, such as rice.  Drink plenty of water daily.  Decrease saturated fat in the diet, avoid lots of red meat, processed foods, sweets, fast foods, and fried foods.  Exercise:  Aerobic exercise helps maintain good heart health. At least 30-40 minutes of moderate-intensity exercise is recommended. For example, a brisk walk that increases your heart rate and breathing. This should be done on most days of the week.   Find a type of exercise or a variety of exercises that you enjoy so that it becomes a part of your daily life.  Examples are running, walking, swimming, water aerobics, and biking.  For motivation and support, explore group exercise such as aerobic class, spin class, Zumba, Yoga,or  martial arts, etc.    Set exercise goals for yourself, such as a certain weight goal, walk or run in a race such as a 5k walk/run.  Speak to your primary care provider about exercise goals.  Disease prevention:  If you smoke or chew tobacco, find out from your caregiver how to quit. It can literally save your life, no matter how long you have been a tobacco user. If you do not use tobacco, never begin.   Maintain a healthy diet and normal weight. Increased weight leads to problems with blood pressure and diabetes.   The Body Mass Index or BMI is a way of measuring how much of  your body is fat. Having a BMI above 27 increases the risk of heart disease, diabetes, hypertension, stroke and other problems related to obesity. Your caregiver can help determine your BMI and based on it develop an exercise and dietary program to help you achieve or maintain this important measurement at a healthful level.  High blood pressure causes heart and blood vessel problems.  Persistent high blood pressure should be treated with medicine if weight loss and exercise do not work.   Fat and cholesterol leaves deposits in your arteries that can block them. This causes heart disease and vessel disease elsewhere in your body.  If your cholesterol is found to be high, or if you have heart disease or certain other medical conditions, then you may need to have your cholesterol monitored frequently and be treated with medication.   Ask if you should have a cardiac stress test if your history suggests this. A stress test is a test done on a treadmill that looks for heart disease. This test can find disease prior to there being a problem.  Menopause can be associated with physical symptoms and risks. Hormone replacement therapy is available to decrease these. You should talk to your caregiver about whether starting or continuing to take hormones is right for you.   Osteoporosis is a disease in which the bones lose minerals and strength as we age. This can result in serious bone fractures. Risk of osteoporosis can be identified using a bone density scan. Women ages 16 and over should discuss this with their caregivers, as should women after menopause who have other risk factors. Ask your caregiver whether you should be taking a calcium supplement and Vitamin D, to reduce the rate of osteoporosis.   Avoid drinking alcohol in excess (more than two drinks per day).  Avoid use of street drugs. Do not share needles with anyone. Ask for professional help if you need assistance or instructions on stopping the use  of alcohol, cigarettes, and/or drugs.  Brush your teeth twice a day with fluoride toothpaste, and floss once a day. Good oral hygiene prevents tooth decay and gum disease. The problems can be painful, unattractive, and can cause other health problems. Visit your dentist for a routine oral and dental check up and preventive care every 6-12 months.   Look at your skin regularly.  Use a mirror to look at your back. Notify your caregivers of changes in moles, especially if there are changes in shapes, colors, a size larger than a pencil eraser, an irregular border, or development of new moles.  Safety:  Use seatbelts 100% of the time, whether driving or as a passenger.  Use safety devices such as hearing protection if you work in environments with loud noise or significant background noise.  Use safety glasses when doing any work that could send debris in to the eyes.  Use a helmet if you ride a bike or motorcycle.  Use appropriate safety gear for contact sports.  Talk to your caregiver about gun safety.  Use sunscreen with a SPF (or skin  protection factor) of 15 or greater.  Lighter skinned people are at a greater risk of skin cancer. Don't forget to also wear sunglasses in order to protect your eyes from too much damaging sunlight. Damaging sunlight can accelerate cataract formation.   Practice safe sex. Use condoms. Condoms are used for birth control and to help reduce the spread of sexually transmitted infections (or STIs).  Some of the STIs are gonorrhea (the clap), chlamydia, syphilis, trichomonas, herpes, HPV (human papilloma virus) and HIV (human immunodeficiency virus) which causes AIDS. The herpes, HIV and HPV are viral illnesses that have no cure. These can result in disability, cancer and death.   Keep carbon monoxide and smoke detectors in your home functioning at all times. Change the batteries every 6 months or use a model that plugs into the wall.   Vaccinations:  Stay up to date with  your tetanus shots and other required immunizations. You should have a booster for tetanus every 10 years. Be sure to get your flu shot every year, since 5%-20% of the [place] population comes down with the flu. The flu vaccine changes each year, so being vaccinated once is not enough. Get your shot in the fall, before the flu season peaks.   Other vaccines to consider:  Human Papilloma Virus or HPV causes cancer of the cervix, and other infections that can be transmitted from person to person. There is a vaccine for HPV, and females should get immunized between the ages of 27 and 42. It requires a series of 3 shots.   Pneumococcal vaccine to protect against certain types of pneumonia.  This is normally recommended for adults age 51 or older.  However, adults younger than 54 years old with certain underlying conditions such as diabetes, heart or lung disease should also receive the vaccine.  Shingles vaccine to protect against Varicella Zoster if you are older than age 45, or younger than 54 years old with certain underlying illness.  Hepatitis A vaccine to protect against a form of infection of the liver by a virus acquired from food.  Hepatitis B vaccine to protect against a form of infection of the liver by a virus acquired from blood or body fluids, particularly if you work in health care.  If you plan to travel internationally, check with your local health department for specific vaccination recommendations.  Cancer Screening:  Breast cancer screening is essential to preventive care for women. All women age 41 and older should perform a breast self-exam every month. At age 63 and older, women should have their caregiver complete a breast exam each year. Women at ages 37 and older should have a mammogram (x-ray film) of the breasts. Your caregiver can discuss how often you need mammograms.    Cervical cancer screening includes taking a Pap smear (sample of cells examined under a microscope) from  the cervix (end of the uterus). It also includes testing for HPV (Human Papilloma Virus, which can cause cervical cancer). Screening and a pelvic exam should begin at age 43, or 3 years after a woman becomes sexually active. Screening should occur every year, with a Pap smear but no HPV testing, up to age 32. After age 3, you should have a Pap smear every 3 years with HPV testing, if no HPV was found previously.   Most routine colon cancer screening begins at the age of 48. On a yearly basis, doctors may provide special easy to use take-home tests to check for hidden blood in the  stool. Sigmoidoscopy or colonoscopy can detect the earliest forms of colon cancer and is life saving. These tests use a small camera at the end of a tube to directly examine the colon. Speak to your caregiver about this at age 54, when routine screening begins (and is repeated every 5 years unless early forms of pre-cancerous polyps or small growths are found).    Venas varicosas (Varicose Veins) Las venas varicosas son venas que se han agrandado y tornado sinuosas. Suelen aparecer Cox Communicationsen las piernas, pero tambin pueden verse en otra parte del cuerpo. CAUSAS Esta afeccin se presenta como consecuencia del mal funcionamiento de las vlvulas de las venas, las cuales ayudan al retorno de la sangre desde las piernas hacia el corazn. Si estas vlvulas se daan, la sangre retrocede y regresa a las venas de la pierna, cerca de la superficie de la piel, lo que causa dilatacin venosa. FACTORES DE RIESGO Las personas que estn mucho tiempo paradas, las embarazadas o las personas con sobrepeso tienen ms probabilidades de tener venas varicosas. SIGNOS Y SNTOMAS  Venas abultadas, azuladas y de aspecto sinuoso que se observan con mayor frecuencia en las piernas.  Dolor o sensacin de Development worker, communitypesadez en las piernas. Estos sntomas pueden empeorar al final del da.  Hinchazn de las piernas.  Cambios en el color de la  piel. DIAGNSTICO Generalmente, el mdico puede diagnosticar las venas varicosas al examinarle las piernas. Adems, puede recomendarle que se haga una ecografa de las venas de las piernas. TRATAMIENTO La mayora de las venas varicosas pueden tratarse en casa. Sin embargo, hay otros tratamientos a disposicin de McGraw-Hilllas personas que tienen sntomas persistentes o desean mejorar la apariencia esttica de las venas varicosas. Estas opciones de tratamiento incluyen lo siguiente:  Escleroterapia. Se inyecta una solucin en la vena para anularla.  Tratamiento con lser. Se [place] un rayo lser para calentar la vena y anularla.  Ablacin venosa por radiofrecuencia Se [place]a una corriente elctrica que se produce mediante ondas de radio para anular la vena.  Flebectoma. Se extirpa quirrgicamente la vena a travs de pequeas incisiones que se hacen sobre la vena varicosa.  Ligadura venosa y varicectoma. La vena se extirpa quirrgicamente a travs de incisiones que se realizan sobre la vena varicosa despus de haberla anudado (ligado). INSTRUCCIONES PARA EL CUIDADO EN EL HOGAR   No permanezca sentado o de pie en una posicin durante mucho tiempo. No se siente con las piernas cruzadas. Descanse con las piernas Radiation protection practitionerelevadas durante el da.  Use medias de compresin como le haya indicado su mdico. Estas medias ayudan a evitar la formacin de cogulos sanguneos y a Building services engineerreducir la hinchazn de las piernas.  No use otras prendas que le ajusten todo el contorno de las piernas, la pelvis o la cintura.  Camine todo lo posible para aumentar la circulacin de Risk managerla sangre.  A la noche, eleve el pie de la cama con bloques de 2pulgadas.  Si tiene un corte en la piel sobre la vena y la vena sangra, recustese con la pierna elevada y Colomb[place]jerza presin en el lugar con un pao limpio, hasta que deje de Geophysicist/field seismologistsangrar. Luego aplique un apsito (vendaje) sobre el corte. Consulte al mdico si el sangrado contina. SOLICITE ATENCIN MDICA  SI:  La piel alrededor del tobillo empieza a Lobbyistagrietarse.  Siente dolor, hay enrojecimiento, sensibilidad o hinchazn dura en la pierna sobre una vena.  Est incmodo debido al dolor de la pierna. Esta informacin no tiene Theme park managercomo fin reemplazar el consejo del mdico. Manufacturing engineerAsegrese  de hacerle al mdico cualquier pregunta que tenga. Document Released: 02/08/2005 Document Revised: 08/23/2015 Document Reviewed: 11/02/2015 Elsevier Interactive Patient Education  2017 [employer].

## 2016-04-25 NOTE — Progress Notes (Signed)
Subjective:    Patient ID: Traci Young, female    DOB: 09-Apr-1961, 55 y.o.   MRN: [id number]  HPI Chief Complaint  Patient presents with  . fasting cpe    fasting cpe with pap. right side breast shooting pain   She is here for a complete physical exam. There is an interpreter present. Patient is Spanish-speaking. Last CPE: several years ago Complains of progressively worsening left anterior leg pain for that has been ongoing for several years and varicose veins since giving birth to her 54 year old son. States pain is worse with sitting and somewhat relieved with walking. She states she has not seen anyone in the past for this. Denies calf pain. No claudication.   Reports having some "shooting" pains to her right breast for the past few weeks. Pains are fleeting, nothing worsens pain. Denies skin changes, nipple issues or discharge. Cannot recall when her last mammogram was.   Other providers: Dr. Nile RiggsShapiro ophthalmologist. Dentist: Dr. Excell Seltzerooper  Social history: Lives with husband,  works as Financial traderhouse cleaner Former smoker, quit 22 years ago, smoked 8 cigarettes per day for 5-6 years. Denies drinking alcohol, drug use  Diet: nondiscretionary  Excerise: nothing regular  Immunizations: Tdap 2010. [facility] shot 2 weeks ago at [facility].   Health maintenance:  Mammogram: unknown  Colonoscopy: never Last Gynecological Exam: Pap smear 2010  Last Menstrual cycle: 5-6 months ago  Pregnancies: 4, 3 live births Last Dental Exam: annually Last Eye Exam: 3-4 weeks ago Hepatitis C test: never HIV: never   Wears seatbelt always, uses sunscreen, smoke detectors in home and functioning, does not text while driving and feels safe in home environment.   Reviewed allergies, medications, past medical, surgical, family, and social history.    Review of Systems Review of Systems Constitutional: -fever, -chills, -sweats, -unexpected weight change,-fatigue ENT: -runny nose, -ear  pain, -sore throat Cardiology:  -chest pain, -palpitations, -edema Respiratory: -cough, -shortness of breath, -wheezing Gastroenterology: -abdominal pain, -nausea, -vomiting, -diarrhea, -constipation  Hematology: -bleeding or bruising problems Musculoskeletal: -arthralgias, -myalgias, -joint swelling, -back pain Ophthalmology: -vision changes Urology: -dysuria, -difficulty urinating, -hematuria, -urinary frequency, -urgency Neurology: -headache, -weakness, -tingling, -numbness       Objective:   Physical Exam BP 130/82   Pulse 62   Ht 5' 2.25" (1.581 m)   Wt 187 lb 6.4 oz (85 kg)   BMI 34.00 kg/m   General Appearance:    Alert, cooperative, no distress, appears stated age  Head:    Normocephalic, without obvious abnormality, atraumatic  Eyes:    PERRL, conjunctiva/corneas clear, EOM's intact, fundi    benign  Ears:    Normal TM's and external ear canals  Nose:   Nares normal, mucosa normal, no drainage or sinus   tenderness  Throat:   Lips, mucosa, and tongue normal; teeth and gums normal  Neck:   Supple, no lymphadenopathy;  thyroid:  no   enlargement/tenderness/nodules; no carotid   bruit or JVD  Back:    Spine nontender, no curvature, ROM normal, no CVA     tenderness  Lungs:     Clear to auscultation bilaterally without wheezes, rales or     ronchi; respirations unlabored  Chest Wall:    No tenderness or deformity   Heart:    Regular rate and rhythm, S1 and S2 normal, no murmur, rub   or gallop  Breast Exam:    No tenderness, masses, or nipple discharge or inversion.  No axillary lymphadenopathy  Abdomen:     Soft, non-tender, nondistended, normoactive bowel sounds,    no masses, no hepatosplenomegaly  Genitalia:    Normal external genitalia without lesions.  BUS and vagina normal; cervix without lesions, or cervical motion tenderness. No abnormal vaginal discharge.  Uterus and adnexa not enlarged, nontender, no masses.  Pap performed and chaperone present.   Rectal:     Normal tone, no masses or tenderness; guaiac negative stool  Extremities:   No clubbing, cyanosis or edema. Large varicose veins to anterior RLE. No ulceration.   Pulses:   2+ and symmetric all extremities  Skin:   Skin color, texture, turgor normal, no rashes or lesions  Lymph nodes:   Cervical, supraclavicular, and axillary nodes normal  Neurologic:   CNII-XII intact, normal strength, sensation and gait; reflexes 2+ and symmetric throughout          Psych:   Normal mood, affect, hygiene and grooming.    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, bilateral - Plan: Ambulatory referral to Vascular Surgery  Special screening for malignant neoplasms, colon - Plan: Ambulatory referral to Gastroenterology  Screening for cervical cancer - Plan: Cytology - PAP  Screening for breast cancer - Plan: MM DIGITAL SCREENING BILATERAL  Need for hepatitis C screening test  History of hyperlipidemia - Plan: Lipid panel  Obesity (BMI 30-39.9) - Plan: CBC with Differential/Platelet, Comprehensive metabolic panel, Lipid panel  Screening for STD (sexually transmitted disease) - Plan: RPR, HIV antibody  Anterior leg pain, right - Plan: Ambulatory referral to Vascular Surgery  Discussed that overall she appears to be quite healthy. Discussed that her BMI does place her in the obese category. Counseled on healthy lifestyle changes including watching portion sizes and reducing sugar and carbohydrate intake. Recommend getting at least 150 minutes of physical activity per week. Discussed that her LE are neurovascularly intact and plan to refer her to vein and vascular for further evaluation.  Discussed safety and health promotion. She is up-to-date on  immunizations. STD testing performed per patient request. Hepatitis C screening test performed per guidelines. Mammogram ordered. She is aware that she must call make the appointment. Referral made to GI for screening colonoscopy.  Plan follow-up pending labs.

## 2016-04-26 ENCOUNTER — Ambulatory Visit
Admission: RE | Admit: 2016-04-26 | Discharge: 2016-04-26 | Disposition: A | Discharge status: Home / Self Care | Payer: BLUE CROSS/BLUE SHIELD | Source: Ambulatory Visit | Attending: Family Medicine | Admitting: Family Medicine

## 2016-04-26 ENCOUNTER — Encounter: Payer: Self-pay | Admitting: Internal Medicine

## 2016-04-26 DIAGNOSIS — Z1239 Encounter for other screening for malignant neoplasm of breast: Secondary | ICD-10-CM

## 2016-04-26 LAB — COMPREHENSIVE METABOLIC PANEL
ALBUMIN: 4.8 g/dL (ref 3.6–5.1)
ALK PHOS: 56 U/L (ref 33–130)
ALT: 23 U/L (ref 6–29)
AST: 24 U/L (ref 10–35)
BUN: 13 mg/dL (ref 7–25)
CALCIUM: 9.7 mg/dL (ref 8.6–10.4)
CO2: 27 mmol/L (ref 20–31)
Chloride: 103 mmol/L (ref 98–110)
Creat: 0.61 mg/dL (ref 0.50–1.05)
Glucose, Bld: 101 mg/dL — ABNORMAL HIGH (ref 65–99)
POTASSIUM: 4.4 mmol/L (ref 3.5–5.3)
Sodium: 139 mmol/L (ref 135–146)
TOTAL PROTEIN: 7.5 g/dL (ref 6.1–8.1)
Total Bilirubin: 0.5 mg/dL (ref 0.2–1.2)

## 2016-04-26 LAB — LIPID PANEL
CHOL/HDL RATIO: 4.6 ratio (ref <5.0)
CHOLESTEROL: 207 mg/dL — AB (ref <200)
HDL: 45 mg/dL — ABNORMAL LOW (ref >50)
LDL Cholesterol: 118 mg/dL — ABNORMAL HIGH (ref <100)
Triglycerides: 218 mg/dL — ABNORMAL HIGH (ref <150)
VLDL: 44 mg/dL — ABNORMAL HIGH (ref <30)

## 2016-04-26 LAB — CYTOLOGY - PAP
CHLAMYDIA, DNA PROBE: NEGATIVE
Diagnosis: NEGATIVE
HPV (WINDOPATH): NOT DETECTED
Neisseria Gonorrhea: NEGATIVE

## 2016-04-26 LAB — HIV ANTIBODY (ROUTINE TESTING W REFLEX): HIV: NONREACTIVE

## 2016-04-26 LAB — RPR

## 2016-04-27 ENCOUNTER — Encounter: Payer: Self-pay | Admitting: Internal Medicine

## 2016-04-27 ENCOUNTER — Other Ambulatory Visit: Payer: Self-pay | Admitting: Family Medicine

## 2016-04-27 DIAGNOSIS — N644 Mastodynia: Secondary | ICD-10-CM

## 2016-04-27 LAB — HEMOGLOBIN A1C
HEMOGLOBIN A1C: 5.5 % (ref <5.7)
Mean Plasma Glucose: 111 mg/dL

## 2016-05-01 ENCOUNTER — Other Ambulatory Visit: Payer: Self-pay

## 2016-05-01 ENCOUNTER — Ambulatory Visit
Admission: RE | Admit: 2016-05-01 | Discharge: 2016-05-01 | Disposition: A | Discharge status: Home / Self Care | Payer: BLUE CROSS/BLUE SHIELD | Source: Ambulatory Visit | Attending: Family Medicine | Admitting: Family Medicine

## 2016-05-01 ENCOUNTER — Ambulatory Visit: Payer: Self-pay

## 2016-05-01 DIAGNOSIS — N644 Mastodynia: Secondary | ICD-10-CM

## 2016-05-04 ENCOUNTER — Ambulatory Visit: Payer: BLUE CROSS/BLUE SHIELD | Admitting: Psychology

## 2016-05-04 ENCOUNTER — Other Ambulatory Visit: Payer: Self-pay | Admitting: Vascular Surgery

## 2016-05-04 DIAGNOSIS — I868 Varicose veins of other specified sites: Secondary | ICD-10-CM

## 2016-06-13 ENCOUNTER — Encounter: Payer: Self-pay | Admitting: Vascular Surgery

## 2016-06-20 ENCOUNTER — Encounter: Payer: Self-pay | Admitting: Vascular Surgery

## 2016-06-20 ENCOUNTER — Encounter (HOSPITAL_COMMUNITY): Payer: BLUE CROSS/BLUE SHIELD

## 2016-06-27 ENCOUNTER — Encounter: Payer: Self-pay | Admitting: Internal Medicine

## 2016-07-11 ENCOUNTER — Encounter: Payer: Self-pay | Admitting: Internal Medicine

## 2016-08-15 ENCOUNTER — Encounter: Payer: Self-pay | Admitting: Internal Medicine

## 2017-08-12 NOTE — Progress Notes (Signed)
Subjective:    Patient ID: Traci Young, female    DOB: 05-10-1961, 57 y.o.   MRN: [id number]  HPI Chief Complaint  Patient presents with  . fasting cpe    fasting cpe, eye exam today   She is here for a complete physical exam. Information was obtained via language interpreter service, interpreter on a stick.  Last CPE: 04/25/2016  She is requesting a new referral to GI and vein and vascular. States she was referred in 04/2016 and was unable to go due to loss of health insurance.   Other providers: Dr. Nile RiggsShapiro ophthalmologist. Dentist: Dr. Excell Seltzerooper  Complains of a two week history of nipple tenderness. No discharge, skin changes or breast tenderness otherwise. She is due for a mammogram.   Social history: Lives with husband, works as a Advertising copywriterhousekeeper Denies smoking, drinking alcohol, drug use  Diet: fairly healthy  Excerise: nothing lately   Immunizations: Tdap 2010. She requests a flu shot and Tdap. Will check on Shingrix.   Health maintenance:  Mammogram: 04/2016  Colonoscopy: never  Last Gynecological Exam: 04/2016 Last Menstrual cycle: over a year ago  Last Dental Exam: appointment last week  Last Eye Exam: appointment today   Wears seatbelt always, uses sunscreen, smoke detectors in home and functioning, does not text while driving and feels safe in home environment.   Reviewed allergies, medications, past medical, surgical, family, and social history.    Review of Systems Review of Systems Constitutional: -fever, -chills, -sweats, -unexpected weight change,-fatigue ENT: -runny nose, -ear pain, -sore throat Cardiology:  -chest pain, -palpitations, -edema Respiratory: -cough, -shortness of breath, -wheezing Gastroenterology: -abdominal pain, -nausea, -vomiting, -diarrhea, -constipation  Hematology: -bleeding or bruising problems Musculoskeletal: -arthralgias, -myalgias, -joint swelling, -back pain Ophthalmology: -vision changes Urology: -dysuria,  -difficulty urinating, -hematuria, -urinary frequency, -urgency Neurology: -headache, -weakness, -tingling, -numbness       Objective:   Physical Exam BP 120/80   Pulse 78   Ht 5\' 2"  (1.575 m)   Wt 190 lb 9.6 oz (86.5 kg)   LMP 06/30/2015   BMI 34.86 kg/m   General Appearance:    Alert, cooperative, no distress, appears stated age  Head:    Normocephalic, without obvious abnormality, atraumatic  Eyes:    PERRL, conjunctiva/corneas clear, EOM's intact, fundi    benign  Ears:    Normal TM's and external ear canals  Nose:   Nares normal, mucosa normal, no drainage or sinus   tenderness  Throat:   Lips, mucosa, and tongue normal; teeth and gums normal  Neck:   Supple, no lymphadenopathy;  thyroid:  no   enlargement/tenderness/nodules; no carotid   bruit or JVD  Back:    Spine nontender, no curvature, ROM normal, no CVA     tenderness  Lungs:     Clear to auscultation bilaterally without wheezes, rales or     ronchi; respirations unlabored  Chest Wall:    No tenderness or deformity   Heart:    Regular rate and rhythm, S1 and S2 normal, no murmur, rub   or gallop  Breast Exam:    No masses, or nipple discharge or inversion. Nipples are TTP bilaterally. No axillary lymphadenopathy  Abdomen:     Soft, non-tender, nondistended, normoactive bowel sounds,    no masses, no hepatosplenomegaly  Genitalia:    Declines. Pap smear is up to date     Extremities:   No clubbing, cyanosis or edema. Large varicose vein to anterior RLE. No ulceration.  Pulses:   2+ and symmetric all extremities  Skin:   Skin color, texture, turgor normal, no rashes or lesions  Lymph nodes:   Cervical, supraclavicular, and axillary nodes normal  Neurologic:   CNII-XII intact, normal strength, sensation and gait; reflexes 2+ and symmetric throughout          Psych:   Normal mood, affect, hygiene and grooming.    Urinalysis dipstick: negative       Assessment & Plan:  Routine general medical examination at a  health care facility - Plan: CBC with Differential/Platelet, POCT Urinalysis DIP (Proadvantage Device), Comprehensive metabolic panel, TSH, Lipid panel  Vaccine counseling  Need for Tdap vaccination - Plan: Tdap vaccine greater than or equal to 7yo IM  Needs flu shot - Plan: Flu Vaccine QUAD 36+ mos IM  Screening for breast cancer - Plan: MM DIGITAL SCREENING BILATERAL  Screen for colon cancer - Plan: Ambulatory referral to Gastroenterology  Nipple tenderness  Obesity (BMI 30-39.9) - Plan: TSH, Lipid panel  Varicose veins of both lower extremities without ulcer or inflammation - Plan: Ambulatory referral to Vascular Surgery  Encounter for hepatitis C virus screening test for high risk patient - Plan: Hepatitis C antibody  Mixed hyperlipidemia - Plan: Lipid panel  She appears to be doing well. Pleasant and upbeat.  Counseling done on health maintenance.  Counseling done on obesity, health risks and weight loss.  Referral to vein and vascular for varicose veins per patient request.  Referral to GI for her first screening colonoscopy.  Unclear as to why she is having breast tenderness. Normal exam otherwise. No chance of pregnancy.  Mammogram ordered and she will call to schedule this.  One time hepatitis C screening done per guidelines.  Vaccine counseling done on all components of Tdap and influenza.  History of hyperlipidemia- lipid panel ordered.  Follow up pending labs.

## 2017-08-13 ENCOUNTER — Ambulatory Visit (INDEPENDENT_AMBULATORY_CARE_PROVIDER_SITE_OTHER): Payer: BLUE CROSS/BLUE SHIELD | Admitting: Family Medicine

## 2017-08-13 ENCOUNTER — Encounter: Payer: Self-pay | Admitting: Gastroenterology

## 2017-08-13 ENCOUNTER — Encounter: Payer: Self-pay | Admitting: Family Medicine

## 2017-08-13 VITALS — BP 120/80 | HR 78 | Ht 62.0 in | Wt 190.6 lb

## 2017-08-13 DIAGNOSIS — Z1211 Encounter for screening for malignant neoplasm of colon: Secondary | ICD-10-CM | POA: Diagnosis not present

## 2017-08-13 DIAGNOSIS — E669 Obesity, unspecified: Secondary | ICD-10-CM

## 2017-08-13 DIAGNOSIS — N644 Mastodynia: Secondary | ICD-10-CM

## 2017-08-13 DIAGNOSIS — E782 Mixed hyperlipidemia: Secondary | ICD-10-CM

## 2017-08-13 DIAGNOSIS — Z1159 Encounter for screening for other viral diseases: Secondary | ICD-10-CM | POA: Diagnosis not present

## 2017-08-13 DIAGNOSIS — Z0000 Encounter for general adult medical examination without abnormal findings: Secondary | ICD-10-CM | POA: Diagnosis not present

## 2017-08-13 DIAGNOSIS — I8393 Asymptomatic varicose veins of bilateral lower extremities: Secondary | ICD-10-CM

## 2017-08-13 DIAGNOSIS — Z9189 Other specified personal risk factors, not elsewhere classified: Secondary | ICD-10-CM

## 2017-08-13 DIAGNOSIS — Z23 Encounter for immunization: Secondary | ICD-10-CM

## 2017-08-13 DIAGNOSIS — Z1231 Encounter for screening mammogram for malignant neoplasm of breast: Secondary | ICD-10-CM | POA: Diagnosis not present

## 2017-08-13 DIAGNOSIS — Z7185 Encounter for immunization safety counseling: Secondary | ICD-10-CM

## 2017-08-13 DIAGNOSIS — Z1239 Encounter for other screening for malignant neoplasm of breast: Secondary | ICD-10-CM

## 2017-08-13 DIAGNOSIS — Z7189 Other specified counseling: Secondary | ICD-10-CM | POA: Diagnosis not present

## 2017-08-13 LAB — POCT URINALYSIS DIP (PROADVANTAGE DEVICE)
BILIRUBIN UA: NEGATIVE mg/dL
Bilirubin, UA: NEGATIVE
Blood, UA: NEGATIVE
Glucose, UA: NEGATIVE mg/dL
Leukocytes, UA: NEGATIVE
Nitrite, UA: NEGATIVE
Protein Ur, POC: NEGATIVE mg/dL
SPECIFIC GRAVITY, URINE: 1.03
Urobilinogen, Ur: NEGATIVE
pH, UA: 6 (ref 5.0–8.0)

## 2017-08-13 NOTE — Patient Instructions (Addendum)
Call your insurance company regarding the Shingrix vaccine. This is to prevent against herpes zoster or shingles.   Call and schedule your mammogram.   You will receive a call from the GI office and vein and vascular office as well.   Cut back on calories, carbohydrates. Increase your physical activity.   We will call you with your lab results.   Preventative Care for Adults - Female      MAINTAIN REGULAR HEALTH EXAMS:  A routine yearly physical is a good way to check in with your primary care provider about your health and preventive screening. It is also an opportunity to share updates about your health and any concerns you have, and receive a thorough all-over exam.   Most health insurance companies pay for at least some preventative services.  Check with your health plan for specific coverages.  WHAT PREVENTATIVE SERVICES DO WOMEN NEED?  Adult women should have their weight and blood pressure checked regularly.   Women age 10 and older should have their cholesterol levels checked regularly.  Women should be screened for cervical cancer with a Pap smear and pelvic exam beginning at age 60.  Breast cancer screening generally begins at age 8 with a mammogram and breast exam by your primary care provider.    Beginning at age 47 and continuing to age 35, women should be screened for colorectal cancer.  Certain people may need continued testing until age 40.  Updating vaccinations is part of preventative care.  Vaccinations help protect against diseases such as the flu.  Osteoporosis is a disease in which the bones lose minerals and strength as we age. Women ages 53 and over should discuss this with their caregivers, as should women after menopause who have other risk factors.  Lab tests are generally done as part of preventative care to screen for anemia and blood disorders, to screen for problems with the kidneys and liver, to screen for bladder problems, to check blood sugar, and  to check your cholesterol level.  Preventative services generally include counseling about diet, exercise, avoiding tobacco, drugs, excessive alcohol consumption, and sexually transmitted infections.    GENERAL RECOMMENDATIONS FOR GOOD HEALTH:  Healthy diet:  Eat a variety of foods, including fruit, vegetables, animal or vegetable protein, such as meat, fish, chicken, and eggs, or beans, lentils, tofu, and grains, such as rice.  Drink plenty of water daily.  Decrease saturated fat in the diet, avoid lots of red meat, processed foods, sweets, fast foods, and fried foods.  Exercise:  Aerobic exercise helps maintain good heart health. At least 30-40 minutes of moderate-intensity exercise is recommended. For example, a brisk walk that increases your heart rate and breathing. This should be done on most days of the week.   Find a type of exercise or a variety of exercises that you enjoy so that it becomes a part of your daily life.  Examples are running, walking, swimming, water aerobics, and biking.  For motivation and support, explore group exercise such as aerobic class, spin class, Zumba, Yoga,or  martial arts, etc.    Set exercise goals for yourself, such as a certain weight goal, walk or run in a race such as a 5k walk/run.  Speak to your primary care provider about exercise goals.  Disease prevention:  If you smoke or chew tobacco, find out from your caregiver how to quit. It can literally save your life, no matter how long you have been a tobacco user. If you do not  use tobacco, never begin.   Maintain a healthy diet and normal weight. Increased weight leads to problems with blood pressure and diabetes.   The Body Mass Index or BMI is a way of measuring how much of your body is fat. Having a BMI above 27 increases the risk of heart disease, diabetes, hypertension, stroke and other problems related to obesity. Your caregiver can help determine your BMI and based on it develop an  exercise and dietary program to help you achieve or maintain this important measurement at a healthful level.  High blood pressure causes heart and blood vessel problems.  Persistent high blood pressure should be treated with medicine if weight loss and exercise do not work.   Fat and cholesterol leaves deposits in your arteries that can block them. This causes heart disease and vessel disease elsewhere in your body.  If your cholesterol is found to be high, or if you have heart disease or certain other medical conditions, then you may need to have your cholesterol monitored frequently and be treated with medication.   Ask if you should have a cardiac stress test if your history suggests this. A stress test is a test done on a treadmill that looks for heart disease. This test can find disease prior to there being a problem.  Menopause can be associated with physical symptoms and risks. Hormone replacement therapy is available to decrease these. You should talk to your caregiver about whether starting or continuing to take hormones is right for you.   Osteoporosis is a disease in which the bones lose minerals and strength as we age. This can result in serious bone fractures. Risk of osteoporosis can be identified using a bone density scan. Women ages 2265 and over should discuss this with their caregivers, as should women after menopause who have other risk factors. Ask your caregiver whether you should be taking a calcium supplement and Vitamin D, to reduce the rate of osteoporosis.   Avoid drinking alcohol in excess (more than two drinks per day).  Avoid use of street drugs. Do not share needles with anyone. Ask for professional help if you need assistance or instructions on stopping the use of alcohol, cigarettes, and/or drugs.  Brush your teeth twice a day with fluoride toothpaste, and floss once a day. Good oral hygiene prevents tooth decay and gum disease. The problems can be painful, unattractive,  and can cause other health problems. Visit your dentist for a routine oral and dental check up and preventive care every 6-12 months.   Look at your skin regularly.  Use a mirror to look at your back. Notify your caregivers of changes in moles, especially if there are changes in shapes, colors, a size larger than a pencil eraser, an irregular border, or development of new moles.  Safety:  Use seatbelts 100% of the time, whether driving or as a passenger.  Use safety devices such as hearing protection if you work in environments with loud noise or significant background noise.  Use safety glasses when doing any work that could send debris in to the eyes.  Use a helmet if you ride a bike or motorcycle.  Use appropriate safety gear for contact sports.  Talk to your caregiver about gun safety.  Use sunscreen with a SPF (or skin protection factor) of 15 or greater.  Lighter skinned people are at a greater risk of skin cancer. Don't forget to also wear sunglasses in order to protect your eyes from too much damaging  sunlight. Damaging sunlight can accelerate cataract formation.   Practice safe sex. Use condoms. Condoms are used for birth control and to help reduce the spread of sexually transmitted infections (or STIs).  Some of the STIs are gonorrhea (the clap), chlamydia, syphilis, trichomonas, herpes, HPV (human papilloma virus) and HIV (human immunodeficiency virus) which causes AIDS. The herpes, HIV and HPV are viral illnesses that have no cure. These can result in disability, cancer and death.   Keep carbon monoxide and smoke detectors in your home functioning at all times. Change the batteries every 6 months or use a model that plugs into the wall.   Vaccinations:  Stay up to date with your tetanus shots and other required immunizations. You should have a booster for tetanus every 10 years. Be sure to get your flu shot every year, since 5%-20% of the [place] population comes down with the flu. The flu  vaccine changes each year, so being vaccinated once is not enough. Get your shot in the fall, before the flu season peaks.   Other vaccines to consider:  Human Papilloma Virus or HPV causes cancer of the cervix, and other infections that can be transmitted from person to person. There is a vaccine for HPV, and females should get immunized between the ages of 20 and 44. It requires a series of 3 shots.   Pneumococcal vaccine to protect against certain types of pneumonia.  This is normally recommended for adults age 6 or older.  However, adults younger than 56 years old with certain underlying conditions such as diabetes, heart or lung disease should also receive the vaccine.  Shingles vaccine to protect against Varicella Zoster if you are older than age 61, or younger than 56 years old with certain underlying illness.  Hepatitis A vaccine to protect against a form of infection of the liver by a virus acquired from food.  Hepatitis B vaccine to protect against a form of infection of the liver by a virus acquired from blood or body fluids, particularly if you work in health care.  If you plan to travel internationally, check with your local health department for specific vaccination recommendations.  Cancer Screening:  Breast cancer screening is essential to preventive care for women. All women age 47 and older should perform a breast self-exam every month. At age 49 and older, women should have their caregiver complete a breast exam each year. Women at ages 51 and older should have a mammogram (x-ray film) of the breasts. Your caregiver can discuss how often you need mammograms.    Cervical cancer screening includes taking a Pap smear (sample of cells examined under a microscope) from the cervix (end of the uterus). It also includes testing for HPV (Human Papilloma Virus, which can cause cervical cancer). Screening and a pelvic exam should begin at age 55, or 3 years after a woman becomes sexually  active. Screening should occur every year, with a Pap smear but no HPV testing, up to age 13. After age 74, you should have a Pap smear every 3 years with HPV testing, if no HPV was found previously.   Most routine colon cancer screening begins at the age of 8. On a yearly basis, doctors may provide special easy to use take-home tests to check for hidden blood in the stool. Sigmoidoscopy or colonoscopy can detect the earliest forms of colon cancer and is life saving. These tests use a small camera at the end of a tube to directly examine the colon. Speak  to your caregiver about this at age 10, when routine screening begins (and is repeated every 5 years unless early forms of pre-cancerous polyps or small growths are found).

## 2017-08-14 ENCOUNTER — Telehealth: Payer: Self-pay | Admitting: Family Medicine

## 2017-08-14 DIAGNOSIS — N644 Mastodynia: Secondary | ICD-10-CM

## 2017-08-14 LAB — COMPREHENSIVE METABOLIC PANEL
A/G RATIO: 2 (ref 1.2–2.2)
ALT: 34 IU/L — ABNORMAL HIGH (ref 0–32)
AST: 28 IU/L (ref 0–40)
Albumin: 4.6 g/dL (ref 3.5–5.5)
Alkaline Phosphatase: 75 IU/L (ref 39–117)
BILIRUBIN TOTAL: 0.3 mg/dL (ref 0.0–1.2)
BUN/Creatinine Ratio: 25 — ABNORMAL HIGH (ref 9–23)
BUN: 12 mg/dL (ref 6–24)
CALCIUM: 9.3 mg/dL (ref 8.7–10.2)
CHLORIDE: 103 mmol/L (ref 96–106)
CO2: 19 mmol/L — ABNORMAL LOW (ref 20–29)
Creatinine, Ser: 0.48 mg/dL — ABNORMAL LOW (ref 0.57–1.00)
GFR calc Af Amer: 128 mL/min/{1.73_m2} (ref >59)
GFR calc non Af Amer: 111 mL/min/{1.73_m2} (ref >59)
GLUCOSE: 85 mg/dL (ref 65–99)
Globulin, Total: 2.3 g/dL (ref 1.5–4.5)
POTASSIUM: 4.2 mmol/L (ref 3.5–5.2)
Sodium: 139 mmol/L (ref 134–144)
TOTAL PROTEIN: 6.9 g/dL (ref 6.0–8.5)

## 2017-08-14 LAB — CBC WITH DIFFERENTIAL/PLATELET
BASOS: 1 %
Basophils Absolute: 0 10*3/uL (ref 0.0–0.2)
EOS (ABSOLUTE): 0.1 10*3/uL (ref 0.0–0.4)
EOS: 2 %
HEMATOCRIT: 42.2 % (ref 34.0–46.6)
HEMOGLOBIN: 14.5 g/dL (ref 11.1–15.9)
Immature Grans (Abs): 0 10*3/uL (ref 0.0–0.1)
Immature Granulocytes: 0 %
LYMPHS ABS: 1.5 10*3/uL (ref 0.7–3.1)
Lymphs: 23 %
MCH: 29.1 pg (ref 26.6–33.0)
MCHC: 34.4 g/dL (ref 31.5–35.7)
MCV: 85 fL (ref 79–97)
MONOCYTES: 6 %
Monocytes Absolute: 0.4 10*3/uL (ref 0.1–0.9)
NEUTROS ABS: 4.5 10*3/uL (ref 1.4–7.0)
Neutrophils: 68 %
PLATELETS: 269 10*3/uL (ref 150–379)
RBC: 4.98 x10E6/uL (ref 3.77–5.28)
RDW: 14.4 % (ref 12.3–15.4)
WBC: 6.5 10*3/uL (ref 3.4–10.8)

## 2017-08-14 LAB — LIPID PANEL
CHOLESTEROL TOTAL: 189 mg/dL (ref 100–199)
Chol/HDL Ratio: 4.2 ratio (ref 0.0–4.4)
HDL: 45 mg/dL (ref >39)
LDL Calculated: 115 mg/dL — ABNORMAL HIGH (ref 0–99)
TRIGLYCERIDES: 147 mg/dL (ref 0–149)
VLDL Cholesterol Cal: 29 mg/dL (ref 5–40)

## 2017-08-14 LAB — HEPATITIS C ANTIBODY

## 2017-08-14 LAB — TSH: TSH: 2.22 u[IU]/mL (ref 0.450–4.500)

## 2017-08-14 NOTE — Telephone Encounter (Signed)
Pt requesting that mammogram order be changed to diagnostic because she has breast pain in her nipple area. She said she has discussed this with Vickie but [facility] that Larene BeachVickie will need to change the order to diagnostic.

## 2017-08-15 NOTE — Telephone Encounter (Signed)
Please put in new order. Did she discuss this with you

## 2017-08-15 NOTE — Telephone Encounter (Signed)
Look at message below. I forgot to route this to you

## 2017-08-15 NOTE — Telephone Encounter (Signed)
Nipple tenderness. Please fix this thanks

## 2017-08-15 NOTE — Telephone Encounter (Signed)
Pt was notified to call to schedule mammogram now

## 2017-08-16 LAB — HEPATITIS PANEL, ACUTE
HEP B C IGM: NEGATIVE
Hep A IgM: NEGATIVE
Hep C Virus Ab: <0.1 s/co ratio (ref 0.0–0.9)
Hepatitis B Surface Ag: NEGATIVE

## 2017-08-16 LAB — SPECIMEN STATUS REPORT

## 2017-08-24 ENCOUNTER — Encounter: Payer: Self-pay | Admitting: Internal Medicine

## 2017-08-27 ENCOUNTER — Encounter: Payer: Self-pay | Admitting: Vascular Surgery

## 2017-09-20 ENCOUNTER — Other Ambulatory Visit: Payer: Self-pay

## 2017-09-20 ENCOUNTER — Ambulatory Visit (AMBULATORY_SURGERY_CENTER): Payer: Self-pay | Admitting: *Deleted

## 2017-09-20 VITALS — Ht 61.5 in | Wt 192.0 lb

## 2017-09-20 DIAGNOSIS — Z1211 Encounter for screening for malignant neoplasm of colon: Secondary | ICD-10-CM

## 2017-09-20 MED ORDER — SUPREP BOWEL PREP KIT 17.5-3.13-1.6 GM/177ML PO SOLN: 1.0000 | Freq: Once | ORAL | 0 refills | Status: AC

## 2017-09-20 NOTE — Progress Notes (Signed)
Patient denies any allergies to egg or soy products. Patient denies complications with anesthesia/sedation.  Patient denies oxygen use at home and denies diet medications. Pamphlet given to patient on colonoscopy.  Alba Viveros, Research officer, trade union used during [employer]

## 2017-09-21 ENCOUNTER — Encounter: Payer: Self-pay | Admitting: Gastroenterology

## 2017-10-01 ENCOUNTER — Encounter: Payer: Self-pay | Admitting: Family Medicine

## 2017-10-01 ENCOUNTER — Ambulatory Visit: Payer: BLUE CROSS/BLUE SHIELD | Admitting: Family Medicine

## 2017-10-01 VITALS — BP 120/80 | HR 76 | Ht 61.75 in | Wt 188.0 lb

## 2017-10-01 DIAGNOSIS — E669 Obesity, unspecified: Secondary | ICD-10-CM | POA: Diagnosis not present

## 2017-10-01 DIAGNOSIS — R7401 Elevation of levels of liver transaminase levels: Secondary | ICD-10-CM

## 2017-10-01 DIAGNOSIS — R740 Nonspecific elevation of levels of transaminase and lactic acid dehydrogenase [LDH]: Secondary | ICD-10-CM

## 2017-10-01 NOTE — Progress Notes (Signed)
   Subjective:    Patient ID: Traci Young, female    DOB: Nov 03, 1960, 57 y.o.   MRN: [id number]  HPI Chief Complaint  Patient presents with  . follow-up    follow-up on liver function   She is here to follow up on mildly elevated LDL.  We will repeat her blood work. She denies regular use of NSAIDs or alcohol.  She is eating healthier and more active. Lost weight and is pleased.  Reports feeling good. No new concerns or complaints.   Denies fever, chills, chest pain, abdominal pain, N/V/D.   Reviewed allergies, medications, past medical, surgical, family, and social history.    Review of Systems Pertinent positives and negatives in the history of present illness.     Objective:   Physical Exam BP 120/80   Pulse 76   Ht 5' 1.75" (1.568 m)   Wt 188 lb (85.3 kg)   LMP 06/30/2015   BMI 34.66 kg/m   Alert and oriented and in no acute distress.  Not otherwise examined.      Assessment & Plan:  Elevated ALT measurement - Plan: Hepatic Function Panel  Obesity (BMI 30-39.9)  Discussed that her ALT was just minimally elevated and negative hepatitis panel.  We will recheck hepatic function. Congratulated her on 4 pound weight loss and encouraged her to continue with healthy diet and exercise.  Follow-up pending labs.

## 2017-10-02 ENCOUNTER — Encounter: Payer: Self-pay | Admitting: Gastroenterology

## 2017-10-02 ENCOUNTER — Other Ambulatory Visit: Payer: Self-pay

## 2017-10-02 ENCOUNTER — Ambulatory Visit (AMBULATORY_SURGERY_CENTER): Payer: BLUE CROSS/BLUE SHIELD | Admitting: Gastroenterology

## 2017-10-02 VITALS — BP 104/69 | HR 65 | Temp 98.9°F | Resp 14 | Ht 61.5 in | Wt 192.0 lb

## 2017-10-02 DIAGNOSIS — Z1211 Encounter for screening for malignant neoplasm of colon: Secondary | ICD-10-CM

## 2017-10-02 DIAGNOSIS — Z1212 Encounter for screening for malignant neoplasm of rectum: Secondary | ICD-10-CM

## 2017-10-02 LAB — HEPATIC FUNCTION PANEL
ALBUMIN: 4.8 g/dL (ref 3.5–5.5)
ALK PHOS: 68 IU/L (ref 39–117)
ALT: 40 IU/L — AB (ref 0–32)
AST: 33 IU/L (ref 0–40)
BILIRUBIN TOTAL: 0.4 mg/dL (ref 0.0–1.2)
Bilirubin, Direct: 0.13 mg/dL (ref 0.00–0.40)
Total Protein: 7.3 g/dL (ref 6.0–8.5)

## 2017-10-02 MED ORDER — SODIUM CHLORIDE 0.9 % IV SOLN: 500.0000 mL | Freq: Once | INTRAVENOUS | Status: DC

## 2017-10-02 NOTE — Progress Notes (Signed)
Pt's states no medical or surgical changes since previsit or office visit. 

## 2017-10-02 NOTE — Op Note (Signed)
[facility] Patient Name: Traci Young Procedure Date: 10/02/2017 10:37 AM MRN: [id number] Endoscopist: Sherilyn Cooter L. Myrtie Neither , MD Age: 57 Referring MD:  Date of Birth: Aug 11, 1960 Gender: Female Account #: [id number] Procedure:                Colonoscopy Indications:              Screening for colorectal malignant neoplasm, This                            is the patient's first colonoscopy Medicines:                Monitored Anesthesia Care Procedure:                Pre-Anesthesia Assessment:                           - Prior to the procedure, a History and Physical                            was performed, and patient medications and                            allergies were reviewed. The patient's tolerance of                            previous anesthesia was also reviewed. The risks                            and benefits of the procedure and the sedation                            options and risks were discussed with the patient.                            All questions were answered, and informed consent                            was obtained. Prior Anticoagulants: The patient has                            taken no previous anticoagulant or antiplatelet                            agents. ASA Grade Assessment: II - A patient with                            mild systemic disease. After reviewing the risks                            and benefits, the patient was deemed in                            satisfactory condition to undergo the procedure.  After obtaining informed consent, the colonoscope                            was passed under direct vision. Throughout the                            procedure, the patient's blood pressure, pulse, and                            oxygen saturations were monitored continuously. The                            Colonoscope was introduced through the anus and                            advanced to the  the cecum, identified by                            appendiceal orifice and ileocecal valve. The                            colonoscopy was performed without difficulty. The                            patient tolerated the procedure well. The quality                            of the bowel preparation was excellent. The                            ileocecal valve, appendiceal orifice, and rectum                            were photographed. Scope In: 10:53:08 AM Scope Out: 11:04:48 AM Scope Withdrawal Time: 0 hours 8 minutes 31 seconds  Total Procedure Duration: 0 hours 11 minutes 40 seconds  Findings:                 The perianal and digital rectal examinations were                            normal.                           The entire examined colon appeared normal on direct                            and retroflexion views. Complications:            No immediate complications. Estimated Blood Loss:     Estimated blood loss: none. Impression:               - The entire examined colon is normal on direct and                            retroflexion views.                           -  No specimens collected. Recommendation:           - Patient has a contact number available for                            emergencies. The signs and symptoms of potential                            delayed complications were discussed with the                            patient. Return to normal activities tomorrow.                            Written discharge instructions were provided to the                            patient.                           - Resume previous diet.                           - Continue present medications.                           - Repeat colonoscopy in 10 years for screening                            purposes.  L. Myrtie Neither, MD 10/02/2017 11:20:08 AM This report has been signed electronically.

## 2017-10-02 NOTE — Progress Notes (Signed)
Report to RN, VSS, adequate respirations noted, no c/o pain or discomfort 

## 2017-10-02 NOTE — Patient Instructions (Signed)
  Thank you for allowing [place] to care for you today!  Repeat Colonoscopy in 10 years.   YOU HAD AN ENDOSCOPIC PROCEDURE TODAY AT THE [facility]:   Refer to the procedure report that was given to you for any specific questions about what was found during the examination.  If the procedure report does not answer your questions, please call your gastroenterologist to clarify.  If you requested that your care partner not be given the details of your procedure findings, then the procedure report has been included in a sealed envelope for you to review at your convenience later.  YOU SHOULD EXPECT: Some feelings of bloating in the abdomen. Passage of more gas than usual.  Walking can help get rid of the air that was put into your GI tract during the procedure and reduce the bloating. If you had a lower endoscopy (such as a colonoscopy or flexible sigmoidoscopy) you may notice spotting of blood in your stool or on the toilet paper. If you underwent a bowel prep for your procedure, you may not have a normal bowel movement for a few days.  Please Note:  You might notice some irritation and congestion in your nose or some drainage.  This is from the oxygen used during your procedure.  There is no need for concern and it should clear up in a day or so.  SYMPTOMS TO REPORT IMMEDIATELY:   Following lower endoscopy (colonoscopy or flexible sigmoidoscopy):  Excessive amounts of blood in the stool  Significant tenderness or worsening of abdominal pains  Swelling of the abdomen that is new, acute  Fever of 100F or higher   For urgent or emergent issues, a gastroenterologist can be reached at any hour by calling [phone].   DIET:  We do recommend a small meal at first, but then you may proceed to your regular diet.  Drink plenty of fluids but you should avoid alcoholic beverages for 24 hours.  ACTIVITY:  You should plan to take it easy for the rest of today and you should NOT DRIVE or use  heavy machinery until tomorrow (because of the sedation medicines used during the test).    FOLLOW UP: Our staff will call the number listed on your records the next business day following your procedure to check on you and address any questions or concerns that you may have regarding the information given to you following your procedure. If we do not reach you, we will leave a message.  However, if you are feeling well and you are not experiencing any problems, there is no need to return our call.  We will assume that you have returned to your regular daily activities without incident.  If any biopsies were taken you will be contacted by phone or by letter within the next 1-3 weeks.  Please call us at [phone] if you have not heard about the biopsies in 3 weeks.    SIGNATURES/CONFIDENTIALITY: You and/or your care partner have signed paperwork which will be entered into your electronic medical record.  These signatures attest to the fact that that the information above on your After Visit Summary has been reviewed and is understood.  Full responsibility of the confidentiality of this discharge information lies with you and/or your care-partner.

## 2017-10-03 ENCOUNTER — Encounter: Payer: Self-pay | Admitting: Internal Medicine

## 2017-10-03 ENCOUNTER — Telehealth: Payer: Self-pay

## 2017-10-03 NOTE — Telephone Encounter (Signed)
  Follow up Call-  Call Traci Young number 10/02/2017  Post procedure Call Traci Young phone  # [phone]  Permission to leave phone message Yes  Some recent data might be hidden     Patient questions:  Do you have a fever, pain , or abdominal swelling? No. Pain Score  0 *  Have you tolerated food without any problems? Yes.    Have you been able to return to your normal activities? Yes.    Do you have any questions about your discharge instructions: Diet   No. Medications  No. Follow up visit  No.  Do you have questions or concerns about your Care? No.  Actions: * If pain score is 4 or above: No action needed, pain <4.

## 2017-12-10 ENCOUNTER — Other Ambulatory Visit: Payer: Self-pay

## 2017-12-10 DIAGNOSIS — I83893 Varicose veins of bilateral lower extremities with other complications: Secondary | ICD-10-CM

## 2018-01-11 ENCOUNTER — Other Ambulatory Visit: Payer: Self-pay

## 2018-01-11 ENCOUNTER — Ambulatory Visit (HOSPITAL_COMMUNITY)
Admission: RE | Admit: 2018-01-11 | Discharge: 2018-01-11 | Disposition: A | Discharge status: Home / Self Care | Payer: BLUE CROSS/BLUE SHIELD | Source: Ambulatory Visit | Attending: Vascular Surgery | Admitting: Vascular Surgery

## 2018-01-11 ENCOUNTER — Encounter: Payer: Self-pay | Admitting: Vascular Surgery

## 2018-01-11 ENCOUNTER — Ambulatory Visit: Payer: BLUE CROSS/BLUE SHIELD | Admitting: Vascular Surgery

## 2018-01-11 VITALS — BP 117/75 | HR 80 | Temp 97.1°F | Resp 16 | Ht 61.5 in | Wt 184.0 lb

## 2018-01-11 DIAGNOSIS — I83893 Varicose veins of bilateral lower extremities with other complications: Secondary | ICD-10-CM | POA: Insufficient documentation

## 2018-01-11 NOTE — Progress Notes (Signed)
Patient ID: Ignacia Bayley, female   DOB: Nov 20, 1960, 57 y.o.   MRN: [id number]  Reason for Consult: New Patient (Initial Visit) (Ref by Hetty Blend, NP for V V.  No previous vein procedure. )   Referred by Avanell Shackleton, NP-C  Subjective:     HPI:  Tinia Oravec Makyna Niehoff is a 57 y.o. 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  She has never had bleeding from these.  She does not know of ever having a DVT.  The started after childbirth many years ago.  She has never had any intervention.  She does not wear compression stocking.  She is hopeful to have these treated in the future.  Her primary language is Spanish but she is quite proficient in [place] as well.  Due to this history was obtained via interpreter.  Past Medical History:  Diagnosis Date  . Elevated lipids    no meds, diet  controlled  . Obesity   . SVD (spontaneous vaginal delivery)    x 4   Family History  Problem Relation Age of Onset  . Heart disease Mother 34  . Heart disease Father 40  . Diabetes Father   . Colon cancer Neg Hx   . Rectal cancer Neg Hx   . Stomach cancer Neg Hx    Past Surgical History:  Procedure Laterality Date  . SHOULDER SURGERY Right   . TUBAL LIGATION      Short Social History:  Social History   Tobacco Use  . Smoking status: Former Smoker    Packs/day: 0.25    Years: 6.00    Pack years: 1.50    Types: Cigarettes  . Smokeless tobacco: Never Used  . Tobacco comment: Patient quit smoking 23 years ago  Substance Use Topics  . Alcohol use: No    No Known Allergies  No current outpatient medications on file.   Current Facility-Administered Medications  Medication Dose Route Frequency Provider Last Rate Last Dose  . 0.9 %  sodium chloride infusion  500 mL Intravenous Once Sherrilyn Rist, MD        Review of Systems  Constitutional:  Constitutional negative. HENT: HENT negative.  Eyes: Eyes negative.  Respiratory:  Respiratory negative.  Cardiovascular: Positive for leg swelling.  GI: Gastrointestinal negative.  Musculoskeletal: Positive for leg pain.  Neurological: Neurological negative. Hematologic: Hematologic/lymphatic negative.  Psychiatric: Psychiatric negative.        Objective:  Objective   Vitals:   01/11/18 1444  BP: 117/75  Pulse: 80  Resp: 16  Temp: (!) 97.1 F (36.2 C)  TempSrc: Oral  SpO2: 95%  Weight: 184 lb (83.5 kg)  Height: 5' 1.5" (1.562 m)   Body mass index is 34.2 kg/m.  Physical Exam  Constitutional: She is oriented to person, place, and time. She appears well-developed.  HENT:  Head: Normocephalic.  Eyes: Pupils are equal, round, and reactive to light.  Neck: Normal range of motion.  Cardiovascular: Normal rate.  Pulses:      Radial pulses are 2+ on the right side, and 2+ on the left side.       Dorsalis pedis pulses are 2+ on the right side, and 2+ on the left side.  Abdominal: Soft.  Musculoskeletal: She exhibits no edema.  Very large varicose veins appearing bilateral thighs down into her bilateral calves  Neurological: She is alert and oriented to person, place, and time.  Skin: Skin is warm and  dry.  Psychiatric: She has a normal mood and affect. Her behavior is normal. Judgment and thought content normal.    Data: I have independently interpreted her venous reflux study which demonstrates reflux throughout her greater saphenous veins bilaterally with large varicose veins present.  Right reflux up to 4500 ms and left greater than 4000.     Assessment/Plan:    57 year old female with significant bilateral lower extremity venous reflux disease with C3 venous disease.  We will get her thigh-high compression stockings and have her follow-up in 3 months for consideration of venous ablation and stab phlebectomy.  I discussed this with her via interpreter and she demonstrates good understanding.      Maeola HarmanBrandon Christopher Jada Fass MD [facility]

## 2018-04-03 ENCOUNTER — Other Ambulatory Visit: Payer: Self-pay

## 2018-04-03 ENCOUNTER — Ambulatory Visit (INDEPENDENT_AMBULATORY_CARE_PROVIDER_SITE_OTHER): Payer: BLUE CROSS/BLUE SHIELD | Admitting: Vascular Surgery

## 2018-04-03 ENCOUNTER — Encounter: Payer: Self-pay | Admitting: Vascular Surgery

## 2018-04-03 VITALS — BP 118/74 | HR 92 | Resp 18 | Ht 61.5 in | Wt 185.8 lb

## 2018-04-03 DIAGNOSIS — I83811 Varicose veins of right lower extremities with pain: Secondary | ICD-10-CM

## 2018-04-03 NOTE — Progress Notes (Signed)
Patient name: Traci Young MRN: [id number] DOB: [birth year] Sex: female   HPI: Traci Young is a 57 y.o. female returns for follow-up today.  She was previously seen by Dr. Randie Heinzain about 3 months ago.  She was seen for symptomatic varicose veins with pain and swelling.  [facility].  Patient continues to have pain and swelling in her varicose veins.  This is slightly improved with her compression stockings.  It does not completely resolved.  She complains primarily of symptoms in her right leg.  She has been compliant wearing her compression stockings.  Other medical problems include hyperlipidemia which is controlled.  Past Medical History:  Diagnosis Date  . Elevated lipids    no meds, diet  controlled  . Obesity   . SVD (spontaneous vaginal delivery)    x 4   Past Surgical History:  Procedure Laterality Date  . SHOULDER SURGERY Right   . TUBAL LIGATION      Family History  Problem Relation Age of Onset  . Heart disease Mother 5080  . Heart disease Father 3783  . Diabetes Father   . Colon cancer Neg Hx   . Rectal cancer Neg Hx   . Stomach cancer Neg Hx     SOCIAL HISTORY: Social History   Socioeconomic History  . Marital status: Married    Spouse name: Not on file  . Number of children: Not on file  . Years of education: Not on file  . Highest education level: Not on file  Occupational History  . Not on file  Social Needs  . Financial resource strain: Not on file  . Food insecurity:    Worry: Not on file    Inability: Not on file  . Transportation needs:    Medical: Not on file    Non-medical: Not on file  Tobacco Use  . Smoking status: Former Smoker    Packs/day: 0.25    Years: 6.00    Pack years: 1.50    Types: Cigarettes  . Smokeless tobacco: Never Used  . Tobacco comment: Patient quit smoking 23 years ago  Substance and Sexual Activity  . Alcohol use: No  . Drug  use: No  . Sexual activity: Not on file    Comment: Tubal Ligation  Lifestyle  . Physical activity:    Days per week: Not on file    Minutes per session: Not on file  . Stress: Not on file  Relationships  . Social connections:    Talks on phone: Not on file    Gets together: Not on file    Attends religious service: Not on file    Active member of club or organization: Not on file    Attends meetings of clubs or organizations: Not on file    Relationship status: Not on file  . Intimate partner violence:    Fear of current or ex partner: Not on file    Emotionally abused: Not on file    Physically abused: Not on file    Forced sexual activity: Not on file  Other Topics Concern  . Not on file  Social History Narrative  . Not on file    No Known Allergies  No current outpatient medications on file.   Current Facility-Administered Medications  Medication Dose Route Frequency Provider Last Rate Last Dose  . 0.9 %  sodium chloride infusion  500 mL Intravenous Once Charlie Pitteranis, Henry L III,  MD        ROS:   General:  No weight loss, Fever, chills  Cardiac: No recent episodes of chest pain/pressure, no shortness of breath at rest.  No shortness of breath with exertion.  Denies history of atrial fibrillation or irregular heartbeat  Vascular: No history of rest pain in feet.  No history of claudication.  No history of non-healing ulcer, No history of DVT   Pulmonary: No home oxygen, no productive cough, no hemoptysis,  No asthma or wheezing   Physical Examination  Vitals:   04/03/18 1529  BP: 118/74  Pulse: 92  Resp: 18  SpO2: 96%  Weight: 185 lb 12.8 oz (84.3 kg)  Height: 5' 1.5" (1.562 m)    Body mass index is 34.54 kg/m.  General:  Alert and oriented, no acute distress HEENT: Normal Cardiac: Regular Rate and Rhythm Abdomen: Soft, non-tender, non-distended, no mass, no scars Skin: No rash, few some large varicosities beginning in the right mid thigh and extending  down anteriorly across the calf these range in diameter from 7 to 10 mm.  I used the SonoSite today to examine the patient's right greater saphenous vein.  It has diffuse varicosities the begin to develop in the distal right thigh.  There is a common channel that extends from the mid right thigh up to the saphenofemoral junction.  This is 9 mm in diameter. Extremity Pulses:  2+dorsalis pedis pulses bilaterally Musculoskeletal: No deformity or edema  Neurologic: Upper and lower extremity motor 5/5 and symmetric  DATA:  I reviewed the patient's previous duplex exam from January 11, 2018.  This showed greater saphenous vein enlarged with reflux extending from the right mid thigh from 7 to 12 mm.  She also had reflux in the left leg with similar findings although the vein was only 4 to 6 mm.  Patient also had a 3 to 6 mm lesser saphenous bilaterally.  This did not apparently have reflux.          ASSESSMENT: Symptomatic varicose veins with pain and swelling right leg   PLAN: Laser ablation from the mid right greater saphenous up to the saphenofemoral junction.  The tortuous segment with multiple varicosities below this will have to be handled with multiple stab avulsions.  Risk benefits possible complications and procedure details were discussed with the patient today through the interpreter.  These include but not limited to bleeding infection postoperative pain DVT.  She understands and agrees to proceed.  We will schedule her in the near future pending insurance approval.   Fabienne Bruns, MD [facility] [place] Office: [phone] Pager: [phone]

## 2018-04-17 ENCOUNTER — Other Ambulatory Visit: Payer: Self-pay | Admitting: *Deleted

## 2018-04-17 DIAGNOSIS — I83811 Varicose veins of right lower extremities with pain: Secondary | ICD-10-CM

## 2018-04-24 ENCOUNTER — Ambulatory Visit (INDEPENDENT_AMBULATORY_CARE_PROVIDER_SITE_OTHER): Payer: BLUE CROSS/BLUE SHIELD | Admitting: Vascular Surgery

## 2018-04-24 ENCOUNTER — Other Ambulatory Visit: Payer: Self-pay

## 2018-04-24 ENCOUNTER — Encounter: Payer: Self-pay | Admitting: Vascular Surgery

## 2018-04-24 VITALS — BP 116/73 | HR 81 | Temp 97.1°F | Resp 16 | Ht 62.0 in | Wt 186.4 lb

## 2018-04-24 DIAGNOSIS — I83811 Varicose veins of right lower extremities with pain: Secondary | ICD-10-CM | POA: Diagnosis not present

## 2018-04-24 HISTORY — PX: ENDOVENOUS ABLATION SAPHENOUS VEIN W/ LASER: SUR449

## 2018-04-24 NOTE — Progress Notes (Signed)
Laser Ablation Procedure    Date: 04/24/2018   [facility] N[facility] Roth[facility] Young DOB:1960/08/19  Consent signed: Yes    Surgeon:  Dr. Fabienne Brunsharles Isabela Young   Procedure: Laser Ablation: right Greater Saphenous Vein  BP 116/73 (BP Location: Left Arm, Patient Position: Sitting, Cuff Size: Normal)   Pulse 81   Temp (!) 97.1 F (36.2 C) (Oral)   Resp 16   Ht 5\' 2"  (1.575 m)   Wt 186 lb 6.4 oz (84.6 kg)   LMP 06/30/2015   SpO2 97%   BMI 34.09 kg/m   Tumescent Anesthesia: 700 cc 0.9% NaCl with 50 cc Lidocaine HCL 1% and 15 cc 8.4% NaHCO3  Local Anesthesia: 14 cc Lidocaine HCL and NaHCO3 (ratio 2:1)  15 watts continuous mode        Total energy: 815.92 Joules    Total time: 0:54  Right greater saphenous vein was cannulated after administering local anesthesia in the mid right thigh.  This was secondary to tortuous varicosities in the more distal thigh.  Guidewire was advanced using the micropuncture sheath system.  A 5 French sheath was then advanced up to the level saphenofemoral junction.  Laser fiber was placed through this.  Tumescent anesthesia was then infiltrated around the area of the greater saphenous vein.  Ultrasound was used to confirm we were distal to the common femoral vein.  Laser fiber was then switched on and laser ablation of the right greater saphenous vein was commenced withdrawing about 1 cm every second.  The laser was withdrawn all the way to the entry site.  Hemostasis was then obtained with direct pressure.  The patient tolerated this portion of the procedure well.    Stab Phlebectomy: >20 incisions  Sites: Thigh, Calf and Ankle  Patient tolerated procedure well  Notes: Spanish interpreter Traci Young(Traci Young) present in room during procedure.  Description of Procedure:  After marking the course of the secondary varicosities, the patient was placed on the operating table in the supine position, and the right leg was prepped and draped in sterile fashion.   Local  anesthetic was administered and under ultrasound guidance the saphenous vein was accessed with a micro needle and guide wire; then the mirco puncture sheath was placed.  A guide wire was inserted saphenofemoral junction , followed by a 5 french sheath.  The position of the sheath and then the laser fiber below the junction was confirmed using the ultrasound.  Tumescent anesthesia was administered along the course of the saphenous vein using ultrasound guidance. The patient was placed in Trendelenburg position and protective laser glasses were placed on patient and staff, and the laser was fired at 15 watts continuous mode advancing 1-502mm/second for a total of 815.92 joules.   For stab phlebectomies, local anesthetic was administered at the previously marked varicosities, and tumescent anesthesia was administered around the vessels.  Greater than 20 stab wounds were made using the tip of an 11 blade. And using the vein hook, the phlebectomies were performed using a hemostat to avulse the varicosities.  Adequate hemostasis was achieved.     Steri strips were applied to the stab wounds and ABD pads and thigh high compression stockings were applied.  Ace wrap bandages were applied over the phlebectomy sites and at the top of the saphenofemoral junction. Blood loss was less than 15 cc.  The patient ambulated out of the operating room having tolerated the procedure well.  Traci Brunsharles Lakshmi Sundeen, MD [facility] Office:  [phone] Pager: [phone]

## 2018-05-02 ENCOUNTER — Ambulatory Visit (INDEPENDENT_AMBULATORY_CARE_PROVIDER_SITE_OTHER): Payer: Self-pay | Admitting: Vascular Surgery

## 2018-05-02 ENCOUNTER — Ambulatory Visit (HOSPITAL_COMMUNITY)
Admission: RE | Admit: 2018-05-02 | Discharge: 2018-05-02 | Disposition: A | Discharge status: Home / Self Care | Payer: BLUE CROSS/BLUE SHIELD | Source: Ambulatory Visit | Attending: Family | Admitting: Family

## 2018-05-02 ENCOUNTER — Encounter: Payer: Self-pay | Admitting: Vascular Surgery

## 2018-05-02 ENCOUNTER — Other Ambulatory Visit: Payer: Self-pay

## 2018-05-02 VITALS — BP 102/70 | HR 86 | Temp 99.0°F | Resp 18 | Ht 62.0 in | Wt 186.0 lb

## 2018-05-02 DIAGNOSIS — I83811 Varicose veins of right lower extremities with pain: Secondary | ICD-10-CM

## 2018-05-02 NOTE — Progress Notes (Signed)
Patient is a 57 year old female who returns for postoperative follow-up today after recent right greater saphenous laser ablation of multiple stab phlebectomies.  She is very satisfied with her result.  Her pain is improved.  She has minimal soreness.  Physical exam:  Vitals:   05/02/18 1147  BP: 102/70  Pulse: 86  Resp: 18  Temp: 99 F (37.2 C)  TempSrc: Oral  SpO2: 97%  Weight: 186 lb (84.4 kg)  Height: 5\' 2"  (1.575 m)    Mild ecchymosis right upper inner thigh no tenderness multiple healing stab phlebectomy incisions ~Some varicosities across the anterior aspect of her right calf  Data: Duplex ultrasound shows successful closure of the greater saphenous within 2 cm of the saphenofemoral junction no evidence of DVT  Assessment: Doing well status post laser ablation multiple stab phlebectomies right leg.  Plan: Patient will continue to wear knee-high compression stockings.  She will follow-up on an as-needed basis.  Fabienne Brunsharles Fields, MD [facility] Office: [phone] Pager: [phone]

## 2018-09-05 ENCOUNTER — Encounter: Payer: Self-pay | Admitting: Family Medicine

## 2019-03-27 ENCOUNTER — Other Ambulatory Visit: Payer: Self-pay

## 2019-03-27 ENCOUNTER — Ambulatory Visit: Payer: Self-pay | Admitting: Family Medicine

## 2019-04-03 ENCOUNTER — Other Ambulatory Visit: Payer: Self-pay

## 2019-04-03 DIAGNOSIS — Z20822 Contact with and (suspected) exposure to covid-19: Secondary | ICD-10-CM

## 2019-04-05 LAB — NOVEL CORONAVIRUS, NAA: SARS-CoV-2, NAA: NOT DETECTED

## 2019-04-15 ENCOUNTER — Telehealth: Payer: Self-pay | Admitting: Family Medicine

## 2019-04-15 NOTE — Telephone Encounter (Signed)
Left detailed vm message on daughter, Elias Else.  Dr. Jonni Sanger is booked until February and patient would need to be evaluated at Urgent Care.

## 2019-04-15 NOTE — Telephone Encounter (Signed)
Copied from [place] [phone]. Topic: Appointment Scheduling - Scheduling Inquiry for Clinic >> Apr 15, 2019  8:29 AM Lennox Solders wrote: Reason for CRM: pt daughter Traci Young dr Jonni Sanger pt would like to see if dr Jonni Sanger would see her mother as new pt tomorrow.pt had [name]. Pt is having pain in vaginal area for over a month. Please call daughter  Please advise if Dr. Jonni Sanger will work in NP appt tomorrow 04/16/19. Thank you.

## 2019-05-19 ENCOUNTER — Ambulatory Visit (INDEPENDENT_AMBULATORY_CARE_PROVIDER_SITE_OTHER): Payer: 59 | Admitting: Family Medicine

## 2019-05-19 ENCOUNTER — Other Ambulatory Visit: Payer: Self-pay

## 2019-05-19 ENCOUNTER — Encounter: Payer: Self-pay | Admitting: Family Medicine

## 2019-05-19 ENCOUNTER — Other Ambulatory Visit (HOSPITAL_COMMUNITY)
Admission: RE | Admit: 2019-05-19 | Discharge: 2019-05-19 | Disposition: A | Discharge status: Home / Self Care | Payer: 59 | Source: Ambulatory Visit | Attending: Family Medicine | Admitting: Family Medicine

## 2019-05-19 VITALS — BP 100/62 | HR 86 | Temp 97.8°F | Wt 160.4 lb

## 2019-05-19 DIAGNOSIS — Z124 Encounter for screening for malignant neoplasm of cervix: Secondary | ICD-10-CM | POA: Insufficient documentation

## 2019-05-19 DIAGNOSIS — R631 Polydipsia: Secondary | ICD-10-CM | POA: Diagnosis not present

## 2019-05-19 DIAGNOSIS — R7401 Elevation of levels of liver transaminase levels: Secondary | ICD-10-CM | POA: Diagnosis not present

## 2019-05-19 DIAGNOSIS — Z789 Other specified health status: Secondary | ICD-10-CM

## 2019-05-19 DIAGNOSIS — E119 Type 2 diabetes mellitus without complications: Secondary | ICD-10-CM

## 2019-05-19 DIAGNOSIS — N898 Other specified noninflammatory disorders of vagina: Secondary | ICD-10-CM | POA: Diagnosis not present

## 2019-05-19 DIAGNOSIS — R634 Abnormal weight loss: Secondary | ICD-10-CM | POA: Insufficient documentation

## 2019-05-19 DIAGNOSIS — N889 Noninflammatory disorder of cervix uteri, unspecified: Secondary | ICD-10-CM

## 2019-05-19 HISTORY — DX: Type 2 diabetes mellitus without complications: E11.9

## 2019-05-19 LAB — POCT URINALYSIS DIP (PROADVANTAGE DEVICE)
Bilirubin, UA: NEGATIVE
Blood, UA: NEGATIVE
Glucose, UA: >=1000 mg/dL — AB
Leukocytes, UA: NEGATIVE
Nitrite, UA: NEGATIVE
Protein Ur, POC: NEGATIVE mg/dL
Specific Gravity, Urine: 1.02
Urobilinogen, Ur: NEGATIVE
pH, UA: 5.5 (ref 5.0–8.0)

## 2019-05-19 LAB — POCT CBG (FASTING - GLUCOSE)-MANUAL ENTRY: Glucose Fasting, POC: 381 mg/dL — AB (ref 70–99)

## 2019-05-19 LAB — POCT GLYCOSYLATED HEMOGLOBIN (HGB A1C): Hemoglobin A1C: 14 % — AB (ref 4.0–5.6)

## 2019-05-19 MED ORDER — METFORMIN HCL 1000 MG PO TABS: 1000.0000 mg | ORAL_TABLET | Freq: Two times a day (BID) | ORAL | 3 refills | Status: DC

## 2019-05-19 MED ORDER — TRESIBA FLEXTOUCH 100 UNIT/ML ~~LOC~~ SOPN: 10.0000 [IU] | PEN_INJECTOR | Freq: Every day | SUBCUTANEOUS | 0 refills | Status: DC

## 2019-05-19 MED ORDER — FLUCONAZOLE 150 MG PO TABS: 150.0000 mg | ORAL_TABLET | Freq: Once | ORAL | 0 refills | Status: AC

## 2019-05-19 NOTE — Patient Instructions (Signed)
Start on Metformin twice daily with breakfast and supper.   Start on [place] once daily at the same time each day.   Return Wednesday to see me.     Informacin bsica sobre la diabetes Diabetes Basics  La diabetes (diabetes mellitus) es una enfermedad de larga duracin (crnica). Se produce cuando el cuerpo no utiliza Occupational hygienist (glucosa) que se libera de los alimentos despus de comer. La diabetes puede deberse a uno de [employer] o a ambos:  El pncreas no produce suficiente cantidad de una hormona llamada insulina.  El cuerpo no reacciona de forma normal a la insulina que produce. La insulina permite que ciertos azcares (glucosa) ingresen a las clulas del cuerpo. Esto le proporciona energa. Si tiene diabetes, los azcares no pueden ingresar a las clulas. Esto produce un aumento del nivel de Dispensing optician (hiperglucemia). Sigue estas instrucciones en tu casa: Cmo se trata la diabetes? Es posible que tenga que administrarse insulina u otros medicamentos para la diabetes todos los [place] para mantener el nivel de Location manager en la sangre equilibrado. Adminstrese los medicamentos para la diabetes todos los [place] se lo haya indicado el mdico. Haga una lista de los medicamentos para la diabetes aqu: Medicamentos para la diabetes  Nombre del medicamento: ______________________________ ? Cantidad (dosis): ________________ Mellody Drown (a.m./p.m.): _______________ Wilford Grist: ___________________________________  Micki Riley medicamento: ______________________________ ? Cantidad (dosis): ________________ Mellody Drown (a.m./p.m.): _______________ Wilford Grist: ___________________________________  Micki Riley medicamento: ______________________________ ? Cantidad (dosis): ________________ Mellody Drown (a.m./p.m.): _______________ Wilford Grist: ___________________________________ Si [place] insulina, aprender cmo aplicrsela con inyecciones. Es posible que deba ajustar la cantidad en funcin de los  alimentos que coma. Haga una lista de los tipos de [place] aqu: Insulina  Tipo de insulina: ______________________________ ? Cantidad (dosis): ________________ Mellody Drown (a.m./p.m.): _______________ Wilford Grist: ___________________________________  Suezanne Jacquet: ______________________________ ? Cantidad (dosis): ________________ Mellody Drown (a.m./p.m.): _______________ Wilford Grist: ___________________________________  Suezanne Jacquet: ______________________________ ? Cantidad (dosis): ________________ Mellody Drown (a.m./p.m.): _______________ Wilford Grist: ___________________________________  Suezanne Jacquet: ______________________________ ? Cantidad (dosis): ________________ Mellody Drown (a.m./p.m.): _______________ Wilford Grist: ___________________________________  Suezanne Jacquet: ______________________________ ? Cantidad (dosis): ________________ Mellody Drown (a.m./p.m.): _______________ Wilford Grist: ___________________________________ Cmo me controlo el nivel de azcar en la sangre?  Controle sus niveles de azcar en la sangre con un medidor de glucemia segn las indicaciones del mdico. El mdico fijar los objetivos del tratamiento para usted. Generalmente, los resultados de los niveles de azcar en la sangre deben ser los siguientes:  Antes de las comidas (preprandial): de 80 a 130mg /dl (de 4,4 a 7,82mmol/l).  Despus de las comidas (posprandial): por debajo de 180mg /dl (46mmol/l).  Nivel de A1c: menos del 7%. Anote las veces que se controlar los niveles de azcar en la sangre: Controles de azcar en la sangre  Hora: _______________ Wilford Grist: ___________________________________  Mellody Drown: _______________ Notas: ___________________________________  Hora: _______________ Notas: ___________________________________  Hora: _______________ Notas: ___________________________________  Hora: _______________ Notas: ___________________________________  Hora: _______________ Notas:  ___________________________________  Sander Nephew debo saber acerca del nivel bajo de azcar en la sangre? Un nivel bajo de azcar en la sangre se denomina hipoglucemia. Este cuadro ocurre cuando el nivel de azcar en la sangre es igual o menor que 70mg /dl (3,73mmol/l). Entre los sntomas, se pueden incluir los siguientes:  Sentir: ? [place]. ? Preocupacin o nervios (ansiedad). ? Sudoracin y [employer]. ? Confusin. ? Mareos. ? Somnolencia. ? Ganas de vomitar (nuseas).  Tener: ? Latidos cardacos acelerados. ? Dolor de [place]. ? Cambios en la visin. ? Hormigueo y falta de sensibilidad (entumecimiento) alrededor de la boca, los  labios o la lengua. ? Movimientos espasmdicos que no puede controlar (convulsiones).  Dificultades para hacer lo siguiente: ? Moverse (coordinacin). ? [place]. ? Desmayos. ? Molestarse con facilidad (irritabilidad). Tratamiento del nivel bajo de azcar en la sangre Para tratar un nivel bajo de azcar en la sangre, ingiera un alimento o una bebida azucarada de inmediato. Si puede pensar con claridad y tragar de manera segura, siga la regla 15/15, que consiste en lo siguiente:  Consuma 15gramos de un hidrato de carbono de accin rpida (carbohidrato). Hable con su mdico acerca de cunto debera consumir.  Algunos hidratos de carbono de accin rpida son: ? Comprimidos de azcar (pastillas de glucosa). Consuma 3o 4pastillas de glucosa. ? De 6 a 8unidades de caramelos duros. ? De 4 a 6onzas (de 120 a ) de jugo de frutas. ? De 4 a 6onzas (de 120 a ) de refresco comn (no diettico). ? 1 cucharada (94ml) de miel o azcar.  Contrlese el nivel de azcar en la sangre despus de ingerir el hidrato de carbono.  Si el nivel de azcar en la sangre todava es igual o menor que 70mg /dl ( ), ingiera nuevamente 15gramos de un hidrato de carbono.  Si el nivel de azcar en la sangre no supera los 70mg /dl (7,2ZDGU/Y) despus de  3intentos, solicite ayuda de inmediato.  Ingiera una comida o una colacin en el transcurso de 1hora despus de que el nivel de azcar en la sangre se haya normalizado. Tratamiento del nivel muy bajo de azcar en la sangre Si el nivel de azcar en la sangre es igual o menor que 54mg /dl (74mmol/l), significa que est muy bajo (hipoglucemia grave). Esto es 4,0HKVQ/Q. No espere a ver si los sntomas desaparecen. Solicite atencin mdica de inmediato. Comunquese con el servicio de emergencias de su localidad (911 en los [place]). No conduzca por sus propios medios hospital. Preguntas para hacerle al mdico  Es necesario que me rena con 1m en el cuidado de la diabetes?  Qu equipos necesitar para cuidarme en casa?  Qu medicamentos para la diabetes necesito? Cundo debo tomarlos?  Con qu frecuencia debo controlar mi nivel de azcar en la sangre?  A qu nmero puedo llamar si tengo preguntas?  Cundo es mi prxima cita con el mdico?  Dnde puedo encontrar un grupo de apoyo para las personas con diabetes? Dnde buscar ms informacin  American Diabetes Association (Asociacin Estadounidense de la Diabetes): www.diabetes.org  American Association of Diabetes Educators (Asociacin Estadounidense de Instructores para el Cuidado de la Diabetes): www.diabeteseducator.org/patient-resources Comunquese con un mdico si:  El nivel de azcar en la sangre es igual o mayor que 240mg /dl (Radio broadcast assistant) durante 2das seguidos.  Ha estado enfermo o ha tenido fiebre durante 2das o ms y no mejora.  Tiene alguno de estos problemas durante ms de 6horas: ? No puede comer ni beber. ? Siente malestar estomacal (nuseas). ? Vomita. ? Presenta heces lquidas (diarrea). Solicite ayuda inmediatamente si:  El nivel de azcar en la sangre est por debajo de 54mg /dl (37mmol/l).  Se siente confundida.  Tiene dificultad para hacer lo siguiente: ? Pensar  con claridad. ? La respiracin. Resumen  La diabetes (diabetes mellitus) es una enfermedad de larga duracin (crnica). Se produce cuando el cuerpo no utiliza IT trainer (glucosa) que se libera de los alimentos despus de la digestin.  Aplquese la insulina y tome los medicamentos para la diabetes como se lo hayan indicado.  Contrlese el nivel de azcar en la sangre todos los  das, con la frecuencia que le hayan indicado.  Concurra a todas las visitas de [address] se lo haya indicado el mdico. Esto es importante. Esta informacin no tiene Theme park manager el consejo del mdico. Asegrese de hacerle al mdico cualquier pregunta que tenga. Document Revised: 06/26/2018 Document Reviewed: 09/07/2017 Elsevier Patient Education  2020 [employer].

## 2019-05-19 NOTE — Progress Notes (Signed)
Subjective:    Patient ID: Traci Young, female    DOB: 02/19/1961, 59 y.o.   MRN: [id number]  HPI Chief Complaint  Patient presents with  . yeast infection    yeast infection. since the beginning of december, itchy, red, no pain with urination   She is a spanish speaking 59 year old postmenopausal female who is here with complaints of vaginal itching.  The interpreter on a stick was used to help with interpreting.   Denies hx of recurrent yeast or BV.   No sex in 2 months.   Denies fever, chills, dizziness, chest pain, palpitations, shortness of breath, abdominal pain, back pain, N/V/D, LE edema.   States she has lost 25 lbs over the past year without trying. She also reports increased thirst, urinary frequency and decreased appetite.   She is overdue for cancer screenings including pap smear and mammogram.  States she has an upcoming CPE with me.   Denies history of diabetes but she has a family history of diabetes in her father.   Reviewed allergies, medications, past medical, surgical, family, and social history.   Review of Systems Pertinent positives and negatives in the history of present illness.     Objective:   Physical Exam Exam conducted with a chaperone present.  Constitutional:      General: She is not in acute distress.    Appearance: She is not ill-appearing.  Cardiovascular:     Rate and Rhythm: Normal rate and regular rhythm.     Pulses: Normal pulses.  Pulmonary:     Effort: Pulmonary effort is normal.     Breath sounds: Normal breath sounds.  Genitourinary:    Vagina: Erythema present.     Cervix: Lesion present.        Comments: Cervical lesion that appears to be a hemangioma vs polyp. She also has yellowish tissue overgrowth Skin:    General: Skin is warm and dry.     Capillary Refill: Capillary refill takes less than 2 seconds.  Neurological:     Mental Status: She is alert.    BP 100/62   Pulse 86   Temp 97.8 F  (36.6 C)   Wt 160 lb 6.4 oz (72.8 kg)   LMP 06/30/2015   SpO2 98%   BMI 29.34 kg/m       Assessment & Plan:  Diabetes mellitus, new onset (HCC) - Plan: CBC with Differential, Comprehensive metabolic panel, POCT glucose (manual entry), POCT Urinalysis DIP (Proadvantage Device), TSH, T4, Free, metFORMIN (GLUCOPHAGE) 1000 MG tablet, Glucose (CBG), Fasting -Hgb A1c >14%, POCT glucose 381, UA 3+glucose and trace of ketones She is not toxic appearing. Suspect this has been a gradual increase of blood sugars and not acute.  Symptomatic with weight loss over the past year, polydipsia, polyuria.  Start on metformin and counseling done on possible side effects. Tresiba prescribed, 10 units,  since I had samples in the office of this medication and can start her right away. She was taught how to administer the medication.  I will have her return in 2 days with a family member or an interpreter and further discuss diabetes and have her start checking BS at that time. She was scheduled today for a 15 minute visit for vaginal itching.    Vaginal itching - Plan: POCT glucose (manual entry) -wet prep +yeast. Treat with diflucan. Most likely related to high BS  Elevated ALT measurement -recheck liver function   Unexplained weight loss - Plan:  POCT glucose (manual entry), HgB A1c -suspect this is related to new onset diabettes  Increased thirst - Plan: POCT glucose (manual entry), HgB A1c  Abnormal appearance of cervix - Plan: Ambulatory referral to Gynecology -refer to gynecologist due to abnormal appearing cervix  Screening for cervical cancer - Plan: Cytology - PAP([place])  Language barrier-used medical interpreter on a stick

## 2019-05-20 LAB — CBC WITH DIFFERENTIAL/PLATELET
Basophils Absolute: 0 10*3/uL (ref 0.0–0.2)
Basos: 1 %
EOS (ABSOLUTE): 0.1 10*3/uL (ref 0.0–0.4)
Eos: 1 %
Hematocrit: 46 % (ref 34.0–46.6)
Hemoglobin: 16 g/dL — ABNORMAL HIGH (ref 11.1–15.9)
Immature Grans (Abs): 0 10*3/uL (ref 0.0–0.1)
Immature Granulocytes: 0 %
Lymphocytes Absolute: 1.9 10*3/uL (ref 0.7–3.1)
Lymphs: 31 %
MCH: 29.6 pg (ref 26.6–33.0)
MCHC: 34.8 g/dL (ref 31.5–35.7)
MCV: 85 fL (ref 79–97)
Monocytes Absolute: 0.4 10*3/uL (ref 0.1–0.9)
Monocytes: 6 %
Neutrophils Absolute: 3.9 10*3/uL (ref 1.4–7.0)
Neutrophils: 61 %
Platelets: 287 10*3/uL (ref 150–450)
RBC: 5.41 x10E6/uL — ABNORMAL HIGH (ref 3.77–5.28)
RDW: 12.8 % (ref 11.7–15.4)
WBC: 6.3 10*3/uL (ref 3.4–10.8)

## 2019-05-20 LAB — COMPREHENSIVE METABOLIC PANEL
ALT: 39 IU/L — ABNORMAL HIGH (ref 0–32)
AST: 35 IU/L (ref 0–40)
Albumin/Globulin Ratio: 1.9 (ref 1.2–2.2)
Albumin: 4.8 g/dL (ref 3.8–4.9)
Alkaline Phosphatase: 138 IU/L — ABNORMAL HIGH (ref 39–117)
BUN/Creatinine Ratio: 20 (ref 9–23)
BUN: 13 mg/dL (ref 6–24)
Bilirubin Total: 0.3 mg/dL (ref 0.0–1.2)
CO2: 24 mmol/L (ref 20–29)
Calcium: 10 mg/dL (ref 8.7–10.2)
Chloride: 95 mmol/L — ABNORMAL LOW (ref 96–106)
Creatinine, Ser: 0.64 mg/dL (ref 0.57–1.00)
GFR calc Af Amer: 115 mL/min/{1.73_m2} (ref >59)
GFR calc non Af Amer: 99 mL/min/{1.73_m2} (ref >59)
Globulin, Total: 2.5 g/dL (ref 1.5–4.5)
Glucose: 371 mg/dL — ABNORMAL HIGH (ref 65–99)
Potassium: 4.3 mmol/L (ref 3.5–5.2)
Sodium: 133 mmol/L — ABNORMAL LOW (ref 134–144)
Total Protein: 7.3 g/dL (ref 6.0–8.5)

## 2019-05-20 LAB — T4, FREE: Free T4: 1.32 ng/dL (ref 0.82–1.77)

## 2019-05-20 LAB — TSH: TSH: 1.76 u[IU]/mL (ref 0.450–4.500)

## 2019-05-21 ENCOUNTER — Other Ambulatory Visit: Payer: Self-pay

## 2019-05-21 ENCOUNTER — Ambulatory Visit (INDEPENDENT_AMBULATORY_CARE_PROVIDER_SITE_OTHER): Payer: 59 | Admitting: Family Medicine

## 2019-05-21 ENCOUNTER — Encounter: Payer: Self-pay | Admitting: Family Medicine

## 2019-05-21 VITALS — BP 110/70 | HR 82 | Temp 98.0°F | Wt 162.4 lb

## 2019-05-21 DIAGNOSIS — E119 Type 2 diabetes mellitus without complications: Secondary | ICD-10-CM | POA: Diagnosis not present

## 2019-05-21 DIAGNOSIS — Z23 Encounter for immunization: Secondary | ICD-10-CM | POA: Diagnosis not present

## 2019-05-21 LAB — GLUCOSE, POCT (MANUAL RESULT ENTRY): POC Glucose: 565 mg/dl — AB (ref 70–99)

## 2019-05-21 MED ORDER — BLOOD GLUCOSE TEST VI STRP: ORAL_STRIP | 2 refills | Status: DC

## 2019-05-21 MED ORDER — ONETOUCH DELICA LANCETS 30G MISC: 1.0000 | Freq: Two times a day (BID) | 1 refills | Status: DC

## 2019-05-21 MED ORDER — JANUMET 50-500 MG PO TABS: 1.0000 | ORAL_TABLET | Freq: Two times a day (BID) | ORAL | 1 refills | Status: DC

## 2019-05-21 NOTE — Patient Instructions (Addendum)
Increase the Tresiba (insulin) to 14 units every evening  Start taking the Janumet 1 tablet with breakfast and 1 tablet with dinner  Check your blood sugars twice daily and keep a record of these  Fasting which is first thing in the morning the goal blood sugar is 80-130  2 hours after a meal the goal blood sugar is 130-160  It will take a few weeks to get your blood sugars in the goal ranges  Make sure you are cutting back on sugar and carbohydrates (potatoes, rice, pasta, bread for example)  I will see you back in 2 weeks.  Please bring in your blood sugar readings  We will need to start you on a cholesterol medication and an ACE inhibitor at that time, this is standard of care for diabetes.    Diabetes mellitus y nutricin, en adultos Diabetes Mellitus and Nutrition, Adult Si sufre de diabetes (diabetes mellitus), es muy importante tener hbitos alimenticios saludables debido a que sus niveles de Psychologist, counselling sangre (glucosa) se ven afectados en gran medida por lo que come y bebe. [place] alimentos saludables en las cantidades [place], aproximadamente a la [employer], [place] ayudar a:  Scientist, physiological glucemia.  Disminuir el riesgo de sufrir una enfermedad cardaca.  Mejorar la presin arterial.  Barista o mantener un peso saludable. Todas las personas que sufren de diabetes son diferentes y cada una tiene necesidades diferentes en cuanto a un plan de alimentacin. El mdico puede recomendarle que trabaje con un especialista en dietas y nutricin (nutricionista) para Tax adviser plan para usted. Su plan de alimentacin puede variar segn factores como:  Las caloras que necesita.  Los medicamentos que toma.  Su peso.  Sus niveles de glucemia, presin arterial y colesterol.  Su nivel de [place].  Otras afecciones que tenga, como enfermedades cardacas o renales. Cmo me afectan los carbohidratos? Los carbohidratos, o hidratos de carbono, afectan su  nivel de glucemia ms que cualquier otro tipo de alimento. La ingesta de carbohidratos naturalmente aumenta la cantidad de [employer]. El recuento de carbohidratos es un mtodo destinado a Midwife un registro de la cantidad de carbohidratos que se consumen. El recuento de carbohidratos es importante para Pharmacologist la glucemia a un nivel saludable, especialmente si utiliza insulina o toma determinados medicamentos por va oral para la diabetes. Es importante conocer la cantidad de carbohidratos que se pueden ingerir en cada comida sin correr Surveyor, minerals. Esto es Government social research officer. Su nutricionista puede ayudarlo a calcular la cantidad de carbohidratos que debe ingerir en cada comida y en cada refrigerio. Entre los alimentos que contienen carbohidratos, se incluyen:  Pan, cereal, arroz, pastas y galletas.  Papas y maz.  Guisantes, frijoles y lentejas.  Leche y Dentist.  Nils Pyle y [place].  Postres, como pasteles, galletas, helado y caramelos. Cmo me afecta el alcohol? El alcohol puede provocar disminuciones sbitas de la glucemia (hipoglucemia), especialmente si utiliza insulina o toma determinados medicamentos por va oral para la diabetes. La hipoglucemia es una afeccin potencialmente mortal. Los sntomas de la hipoglucemia (somnolencia, mareos y confusin) son similares a los sntomas de haber consumido demasiado alcohol. Si el mdico afirma que el alcohol es seguro para usted, [place] estas pautas:  Limite el consumo de alcohol a no ms de por da si es mujer y no est [place], y a si es hombre. Una medida equivale a 12oz ( ) de cerveza, 5oz ( ) de vino o 1oz (45ml) de  bebidas alcohlicas de alta graduacin.  No beba con el estmago vaco.  Mantngase hidratado bebiendo agua, refrescos dietticos o t helado sin azcar.  Tenga en cuenta que los refrescos comunes, los jugos y otras bebida para Optician, dispensing pueden contener mucha azcar y se deben  contar como carbohidratos. Cules son algunos consejos para seguir este plan?  Leer las etiquetas de los alimentos  Comience por leer el tamao de la porcin en la "Informacin nutricional" en las etiquetas de los alimentos envasados y las bebidas. La cantidad de caloras, carbohidratos, grasas y otros nutrientes mencionados en la etiqueta se basan en una porcin del alimento. Muchos alimentos contienen ms de una porcin por envase.  Verifique la cantidad total de gramos (g) de carbohidratos totales en una porcin. Puede calcular la cantidad de porciones de carbohidratos al dividir el total de carbohidratos por 15. Por ejemplo, si un alimento tiene un total de 30g de carbohidratos, equivale a 2 porciones de carbohidratos.  Verifique la cantidad de gramos (g) de grasas saturadas y grasas trans en una porcin. Escoja alimentos que no contengan grasa o que tengan un bajo contenido.  Verifique la cantidad de miligramos (mg) de sal (sodio) en una porcin. La mayora de las personas deben limitar la ingesta de sodio total a menos de 2300mg  por Training and development officer.  Siempre consulte la informacin nutricional de los alimentos etiquetados como "con bajo contenido de grasa" o "sin grasa". Estos alimentos pueden tener un mayor contenido de Location manager agregada o carbohidratos refinados, y deben evitarse.  Hable con su nutricionista para identificar sus objetivos diarios en cuanto a los nutrientes mencionados en la etiqueta. Al ir de compras  Evite comprar alimentos procesados, enlatados o precocinados. Estos alimentos tienden a Special educational needs teacher mayor cantidad de [place], sodio y azcar agregada.  Compre en la zona exterior de la tienda de comestibles. Esta zona incluye frutas y verduras frescas, granos a granel, carnes frescas y productos lcteos frescos. Al cocinar  Utilice mtodos de coccin a baja temperatura, como hornear, en lugar de mtodos de coccin a alta temperatura, como frer en abundante aceite.  Cocine con  aceites saludables, como el aceite de [place], canola o [place].  Evite cocinar con manteca, crema o carnes con alto contenido de grasa. Planificacin de las comidas  Coma las comidas y los refrigerios regularmente, preferentemente a la misma hora todos [place]. Evite pasar largos perodos de tiempo sin comer.  Consuma alimentos ricos en fibra, como frutas frescas, verduras, frijoles y cereales integrales. Consulte a su nutricionista sobre cuntas porciones de carbohidratos puede consumir en cada comida.  Consuma entre 4 y 6 onzas (oz) de protenas magras por da, como carnes [place], pollo, pescado, huevos o tofu. Una onza de protena magra equivale a: ? 1 onza de carne, pollo o pescado. ? 1huevo. ?  taza de tofu.  Coma algunos alimentos por da que contengan grasas saludables, como aguacates, frutos secos, semillas y pescado. Estilo de vida  Controle su nivel de glucemia con regularidad.  Haga actividad fsica habitualmente como se lo haya indicado el mdico. Esto puede incluir lo siguiente: ? 133minutos semanales de ejercicio de intensidad moderada o alta. Esto podra incluir caminatas dinmicas, ciclismo o gimnasia acutica. ? Realizar ejercicios de elongacin y de fortalecimiento, como yoga o levantamiento de pesas, por lo menos 2veces por semana.  Tome los [employer] se lo haya indicado el mdico.  No consuma ningn producto que contenga nicotina o tabaco, como cigarrillos y Psychologist, sport and exercise. Si necesita ayuda para dejar de fumar,  consulte al [place] con un asesor o instructor en diabetes para identificar estrategias para controlar el estrs y cualquier desafo emocional y social. Preguntas para hacerle al mdico  Es necesario que consulte a IT trainer en el cuidado de la diabetes?  Es necesario que me rena con un nutricionista?  A qu nmero puedo llamar si tengo preguntas?  Cules son los mejores momentos para controlar la glucemia? Dnde  encontrar ms informacin:  Asociacin Estadounidense de la Diabetes (American Diabetes Association): diabetes.org  Academia de Nutricin y Pension scheme manager (Academy of Nutrition and Dietetics): www.eatright.org  [employer] de la Diabetes y las Enfermedades Digestivas y [facility] of Diabetes and Digestive and Kidney Diseases, NIH): CarFlippers.tn Resumen  Un plan de alimentacin saludable lo ayudar a Scientist, physiological glucemia y Pharmacologist un estilo de vida saludable.  Trabajar con un especialista en dietas y nutricin (nutricionista) puede ayudarlo a Designer, television/film set de alimentacin para usted.  Tenga en cuenta que los carbohidratos (hidratos de carbono) y el alcohol tienen efectos inmediatos en sus niveles de glucemia. Es importante contar los carbohidratos que ingiere y consumir alcohol con prudencia. Esta informacin no tiene Theme park manager el consejo del mdico. Asegrese de hacerle al mdico cualquier pregunta que tenga. Document Revised: 01/09/2017 Document Reviewed: 08/21/2016 Elsevier Patient Education  2020 [employer].

## 2019-05-21 NOTE — Progress Notes (Signed)
   Subjective:    Patient ID: Traci Young, female    DOB: 1960-07-29, 59 y.o.   MRN: [id number]  HPI Chief Complaint  Patient presents with  . follow-up    follow-up on diabetes   She is non english speaking and the information was obtained via medical translator interpreter on a stick.  She is here to follow up on new diagnosis of diabetes 2 days ago. She was seen for a complaint of vaginal itching but was found to have a Hgb A1c of 14%. She reports the itching has resolved. We needed more time for diabetes education and to teach her how to check her blood sugars at home, therefore, she is back today for this.   States she is feeling good today. Reports improvement in polydipsia also.   States she has been using [place] samples that were given. 10 units every evening at 9:30 pm.  States she has not been able to pick up Metformin.   No new concerns today.   Denies fever, chills, dizziness, chest pain, palpitations, shortness of breath, abdominal pain, N/V/D, urinary symptoms, LE edema.   Reviewed allergies, medications, past medical, surgical, family, and social history.   Review of Systems Pertinent positives and negatives in the history of present illness.     Objective:   Physical Exam BP 110/70   Pulse 82   Temp 98 F (36.7 C)   Wt 162 lb 6.4 oz (73.7 kg)   LMP 06/30/2015   BMI 29.70 kg/m   Alert and oriented and in no distress. Her mood is good.       Assessment & Plan:  Diabetes mellitus, new onset (HCC) - Plan: Glucose (CBG)  Needs flu shot - Plan: Flu Vaccine QUAD 36+ mos PF IM (Fluarix & Fluzone Quad PF)  Need for vaccination against Streptococcus pneumoniae - Plan: Pneumococcal polysaccharide vaccine 23-valent greater than or equal to 2yo subcutaneous/IM  She has been taking [place] 10 units since her last visit 2 days ago. She has not been able to get metformin prescription. Issues with prescription card. She does report improvement in  symptoms. Counseling done on diet and exercise, medications needed for diabetes including a statin and ace inhibitor but we will hold off on these for now so as to not overwhelm her. I will give her samples of Janumet today and send a new prescription to her pharmacy. Increase Tresiba dose to 14 units. We did find that these medications are on her preferred pharmacy list. She was taught how to check her blood sugar and was given a meter and supplies.  Flu and pneumonia vaccines given and counseling done on vaccines.  She will return in 2 weeks and bring in blood sugar readings. Will need to start her on a statin and ace inhibitor and discuss eye exam.  Reviewed labs and will need to recheck CMP at her follow up.  Spent at least 30 minutes in counseling and coordination of care.

## 2019-05-22 ENCOUNTER — Encounter: Payer: Self-pay | Admitting: Internal Medicine

## 2019-05-22 LAB — CYTOLOGY - PAP
Comment: NEGATIVE
Diagnosis: NEGATIVE
High risk HPV: NEGATIVE

## 2019-05-23 ENCOUNTER — Telehealth: Payer: Self-pay | Admitting: Family Medicine

## 2019-05-23 NOTE — Telephone Encounter (Signed)
Pts son called and stated that they were returning your call from yesterday

## 2019-05-23 NOTE — Telephone Encounter (Signed)
Called and spoke to daughter and she was going to let her know results of pap smear

## 2019-06-03 ENCOUNTER — Other Ambulatory Visit: Payer: Self-pay

## 2019-06-03 ENCOUNTER — Ambulatory Visit (INDEPENDENT_AMBULATORY_CARE_PROVIDER_SITE_OTHER): Payer: 59 | Admitting: Family Medicine

## 2019-06-03 ENCOUNTER — Encounter: Payer: Self-pay | Admitting: Family Medicine

## 2019-06-03 VITALS — BP 124/80 | HR 79 | Temp 99.3°F | Wt 164.8 lb

## 2019-06-03 DIAGNOSIS — E119 Type 2 diabetes mellitus without complications: Secondary | ICD-10-CM

## 2019-06-03 DIAGNOSIS — E1169 Type 2 diabetes mellitus with other specified complication: Secondary | ICD-10-CM

## 2019-06-03 DIAGNOSIS — E785 Hyperlipidemia, unspecified: Secondary | ICD-10-CM

## 2019-06-03 DIAGNOSIS — H539 Unspecified visual disturbance: Secondary | ICD-10-CM | POA: Insufficient documentation

## 2019-06-03 MED ORDER — ATORVASTATIN CALCIUM 10 MG PO TABS: 10.0000 mg | ORAL_TABLET | Freq: Every day | ORAL | 1 refills | Status: DC

## 2019-06-03 NOTE — Progress Notes (Signed)
   Subjective:    Patient ID: Traci Young, female    DOB: 10-Jul-1960, 59 y.o.   MRN: [id number]  HPI Chief Complaint  Patient presents with  . follow-up    follow-up   Here today to follow up on diabetes. Medical interpreter on a stick was used.  She presented with vaginal itching on 05/19/2019 and was found to have a blood sugar of 381 and Hgb A1c >14%. She was quite symptomatic with weight loss, polyuria and polydipsia.   States she has a one week history of blurry vision. States she saw Dr. Nile Riggs for her eyes in December. States she is due to order new contact lenses but is worried that her vision has changed and that this may be related to her diabetes.  No trauma, foreign body sensation, double vision or eye pain per patient. No drainage. She is wearing glasses today.   States she has been taking [place] 14 units every evening and Janumet 50-500mg   States she has only been able to take the samples we gave her and has not been able to get her insurance card yet.  She has been checking her BS at home.   BS today 147 today before breakfast.  She last ate at 11:30 am.   Denies fever, chills, headache, dizziness, chest pain, palpitations, shortness of breath, abdominal pain, N/V/D. No urinary symptoms. Denies vaginal itching as at her previous visit.   We have not yet started her on a statin but discussed the need for this to help with prevention of heart disease related to diabetes. She is amenable to starting a statin but states she will have to get her insurance card before getting medication.   Reviewed allergies, medications, past medical, surgical, family, and social history.    Review of Systems Pertinent positives and negatives in the history of present illness.     Objective:   Physical Exam BP 124/80   Pulse 79   Temp 99.3 F (37.4 C)   Wt 164 lb 12.8 oz (74.8 kg)   LMP 06/30/2015   BMI 30.14 kg/m   Alert and oriented and in no acute  distress. PERLLA, EOMs intact, normal conjunctiva. Peripheral vision intact.   PCOT glucose finger strick 111     Assessment & Plan:  Diabetes mellitus, new onset (HCC)  Changes in vision  Hyperlipidemia associated with type 2 diabetes mellitus (HCC) - Plan: atorvastatin (LIPITOR) 10 MG tablet  No red flag symptoms.  Visual acuity L 20/100, R 20/70, B 20/70 She has an appt in 2 days with Dr. Nile Riggs, scheduled by my CMA.  New diabetes. Her blood sugars have responded very well to medication and dietary changes. We will back off on Tresiba from 14 units to 6 units. Continue Janumet.  No longer symptomatic as initial diagnosis.  Will start her on statin, counseling done as to why this is needed.  She will keep a close eye on her BS at home.  Follow up as scheduled for CPE on 06/16/2019 and will recheck Hgb A1c at that time.

## 2019-06-03 NOTE — Patient Instructions (Addendum)
Reduce Tresiba to 6 units daily.   Continue on Janumet once daily.   Start on atorvastatin 10 mg once daily.   See Dr. Nile Riggs on Thursday for your vision problem.

## 2019-06-05 ENCOUNTER — Telehealth: Payer: Self-pay | Admitting: Internal Medicine

## 2019-06-05 NOTE — Telephone Encounter (Signed)
Pt has an appt with Dr. Dione Booze tomorrow 06/06/2019 @ 12:15pm.   Pt has not been on meds except cholesterol. I advised her she needed to be taking her tresiba and her janumet sample that she got. She said ok she will start back on that

## 2019-06-05 NOTE — Telephone Encounter (Signed)
[facility] called and states that they do not take her insurance, pt would have to contact her insurance company to find out an eye doctor under that plan. If she is seen there then she would have to pay out of pocket

## 2019-06-05 NOTE — Telephone Encounter (Signed)
Please help get her an appointment somewhere. I am concerned about her vision and symptoms and want to make sure there is no serious underlying eye issue occurring. Her language barrier may keep her from getting an appt soon. Thanks

## 2019-06-16 ENCOUNTER — Encounter: Payer: Self-pay | Admitting: Family Medicine

## 2019-06-16 ENCOUNTER — Other Ambulatory Visit: Payer: Self-pay

## 2019-06-16 ENCOUNTER — Ambulatory Visit (INDEPENDENT_AMBULATORY_CARE_PROVIDER_SITE_OTHER): Payer: 59 | Admitting: Family Medicine

## 2019-06-16 VITALS — BP 120/70 | HR 68 | Ht 62.5 in | Wt 165.8 lb

## 2019-06-16 DIAGNOSIS — E119 Type 2 diabetes mellitus without complications: Secondary | ICD-10-CM

## 2019-06-16 DIAGNOSIS — R7401 Elevation of levels of liver transaminase levels: Secondary | ICD-10-CM | POA: Diagnosis not present

## 2019-06-16 DIAGNOSIS — H539 Unspecified visual disturbance: Secondary | ICD-10-CM | POA: Diagnosis not present

## 2019-06-16 DIAGNOSIS — E1169 Type 2 diabetes mellitus with other specified complication: Secondary | ICD-10-CM | POA: Diagnosis not present

## 2019-06-16 DIAGNOSIS — Z789 Other specified health status: Secondary | ICD-10-CM

## 2019-06-16 DIAGNOSIS — Z0000 Encounter for general adult medical examination without abnormal findings: Secondary | ICD-10-CM

## 2019-06-16 DIAGNOSIS — E785 Hyperlipidemia, unspecified: Secondary | ICD-10-CM | POA: Diagnosis not present

## 2019-06-16 DIAGNOSIS — Z1231 Encounter for screening mammogram for malignant neoplasm of breast: Secondary | ICD-10-CM

## 2019-06-16 MED ORDER — JANUMET 50-500 MG PO TABS: 1.0000 | ORAL_TABLET | Freq: Two times a day (BID) | ORAL | 1 refills | Status: DC

## 2019-06-16 NOTE — Progress Notes (Signed)
Subjective:    Patient ID: Traci Young, female    DOB: February 06, 1961, 59 y.o.   MRN: [id number]  HPI Chief Complaint  Patient presents with  . fasting cpe    fasting cpe, BS 75-164.    She is here for a complete physical exam. Due to a language barrier, the interpreter on a stick was used for help with interpreting.  She is also following up on new onset diabetes with a Hgb A1c in the toxic range and will need a recheck of this.   Last CPE: 08/13/2017  Other providers: Dr. Oneida Alar- vascular surgery  (varicose veins)  Dr. Gershon Crane - eyes  Dr. Loletha Carrow- GI  Dr. Caryl Comes- Cardiology    New diabetes with a Hgb A1c of >14% on 05/19/2019 and a BS of 381. She was symptomatic with weight loss, polyuria and polydipsia.  She has been taking [place] 6 units daily (reduced from a previous visit) and Janumet twice daily.  BS at home has been mainly  90-140s but she reports having a low of 75 on one occasion.  States she had a brief episode of dizziness. Feels fine today.   HL- elevated LDL in presence of diabetes. Started her on atorvastatin 10 mg on 06/03/2019. States she is doing fine.   At her previous visit she was having difficulty with her vision and has an appt next week with Dr. Gershon Crane.    Social history: Lives with husband, works as a Secretary/administrator Denies smoking, drinking alcohol, drug use  Diet: healthy, low in sugar and carbohydrates  Excerise: walking   Immunizations: up to date on Tdap, flu, pneumovax 23   Health maintenance:  Mammogram: 05/01/2016 Colonoscopy: 10/02/2017 Last Gynecological Exam: 05/19/2019 Has appointment with OB/GYN later this month   Wears seatbelt always, smoke detectors in home and functioning, does not text while driving and feels safe in home environment.   Reviewed allergies, medications, past medical, surgical, family, and social history.    Review of Systems Review of Systems Constitutional: -fever, -chills, -sweats,  -unexpected weight change,-fatigue ENT: -runny nose, -ear pain, -sore throat Cardiology:  -chest pain, -palpitations, -edema Respiratory: -cough, -shortness of breath, -wheezing Gastroenterology: -abdominal pain, -nausea, -vomiting, -diarrhea, -constipation  Hematology: -bleeding or bruising problems Musculoskeletal: -arthralgias, -myalgias, -joint swelling, -back pain Ophthalmology: -vision changes Urology: -dysuria, -difficulty urinating, -hematuria, -urinary frequency, -urgency Neurology: -headache, -weakness, -tingling, -numbness       Objective:   Physical Exam BP 120/70   Pulse 68   Ht 5' 2.5" (1.588 m)   Wt 165 lb 12.8 oz (75.2 kg)   LMP 06/30/2015   BMI 29.84 kg/m   General Appearance:    Alert, cooperative, no distress, appears stated age  Head:    Normocephalic, without obvious abnormality, atraumatic  Eyes:    PERRL, conjunctiva/corneas clear, EOM's intact  Ears:    Normal TM's and external ear canals  Nose:   Mask in place   Throat:   Mask in place   Neck:   Supple, no lymphadenopathy;  thyroid:  no   enlargement/tenderness/nodules; no carotid   bruit or JVD  Back:    Spine nontender, no curvature, ROM normal, no CVA     tenderness  Lungs:     Clear to auscultation bilaterally without wheezes, rales or     ronchi; respirations unlabored  Chest Wall:    No tenderness or deformity   Heart:    Regular rate and rhythm, S1 and S2 normal, no murmur, rub  or gallop  Breast Exam:    OB/GYN   Abdomen:     Soft, non-tender, nondistended, normoactive bowel sounds,    no masses, no hepatosplenomegaly  Genitalia:    Pap UTD. OB/GYN      Extremities:   No clubbing, cyanosis or edema  Pulses:   2+ and symmetric all extremities  Skin:   Skin color, texture, turgor normal, no rashes or lesions  Lymph nodes:   Cervical, supraclavicular, and axillary nodes normal  Neurologic:   CNII-XII intact, normal strength, sensation and gait; reflexes 2+ and symmetric throughout           Psych:   Normal mood, affect, hygiene and grooming.        Assessment & Plan:  Routine general medical examination at a health care facility - Plan: Comprehensive metabolic panel, CBC with Differential/Platelet, Lipid panel -Here today for fasting CPE.  Discussed preventive healthcare and she is overdue for mammogram.  Discussed waiting until she sees her OB/GYN later this month or proceeding with ordering this for her at the breast center and she prefers to go ahead and get this scheduled.  She will call and schedule.  Up-to-date on Pap smear and has a follow-up with OB/GYN due to abnormal finding on exam.  Up-to-date on colonoscopy.  Counseling on healthy lifestyle including diet and exercise.  Discussed safety and health promotion.  Diabetes mellitus, new onset (HCC) - Plan: Comprehensive metabolic panel, EKG 12-Lead, Hemoglobin A1c, Microalbumin / creatinine urine ratio -asymptomatic. Her blood sugars have responded very well with her medications and diet. She did have a low reading in the 70s so I will have her stop insulin and continue on Janumet. Encouraged her to keep a close eye on her readings and report back any additional low readings. Check labs and follow up.   Changes in vision- unchanged since last visit. Has follow up with Dr. Nile Riggs   Elevated ALT measurement - Plan: Comprehensive metabolic panel -negative hepatitis panel. Expect this to improve with blood sugars and weight loss  Hyperlipidemia associated with type 2 diabetes mellitus (HCC) - Plan: Lipid panel, EKG 12-Lead -continue statin therapy. Doing well with this.   Language barrier- she understands a great deal of English but I used the medical interpreter on a stick   Encounter for screening mammogram for malignant neoplasm of breast - Plan: MM DIGITAL SCREENING BILATERAL -ordered per screening guidelines

## 2019-06-16 NOTE — Patient Instructions (Addendum)
Your blood sugars are doing well.  Please stop the [place].  Continue on the Janumet and atorvastatin.  Continue checking your blood sugars and let me know if you have low readings in the 70s or 80s.  When you get your diabetic eye exam next week, please ask your eye doctor to send me a copy of the results.  Call and schedule your mammogram at the breast center.  We will call you with your lab results    Cuidados preventivos en las mujeres de 58 a 58 aos de edad Preventive Care 58-1 Years Old, Female Los cuidados preventivos hacen referencia a las opciones en cuanto a las visitas al mdico y [place], las cuales pueden promover la salud y Musician. Esto puede comprender lo siguiente:  Un examen fsico anual. Esto tambin se puede llamar control de bienestar anual.  Visitas regulares al dentista y exmenes oculares.  Vacunas.  Estudios para Engineer, building services.  Opciones saludables de estilo de vida, como seguir una dieta saludable, hacer ejercicio regularmente, no usar drogas ni productos que contengan nicotina y tabaco, y limitar el consumo de alcohol. Qu puedo esperar para mi visita de cuidado preventivo? Examen fsico El mdico revisar lo siguiente:  IT consultant y [place]. Esto se puede usar para calcular el ndice de masa corporal ([place]), que indica si tiene un peso saludable.  Frecuencia cardaca y presin arterial.  Piel para detectar manchas anormales. Asesoramiento Su mdico puede preguntarle acerca de:  Consumo de tabaco, alcohol y drogas.  Su bienestar emocional.  El bienestar en el hogar y sus relaciones personales.  Su actividad sexual.  Sus hbitos de alimentacin.  Su trabajo y [place] laboral.  Mtodos anticonceptivos.  Su ciclo menstrual.  Sus antecedentes de Media planner. Qu vacunas necesito?  [place] antigripal  Se recomienda aplicarse esta vacuna todos los [place]. Vacuna contra el ttanos, difteria y tos ferina  (Tdap)  Es posible que tenga que aplicarse un refuerzo contra el ttanos y la difteria (DT) cada 10aos. Vacuna contra la varicela  Es posible que tenga que aplicrsela si no recibi esta vacuna. Vacuna contra el herpes zster (culebrilla)  Es posible que la necesite despus de los 55 aos de [place]. Vacuna contra el sarampin, rubola y paperas (SRP)  Es posible que necesite aplicarse al menos una dosis de la vacuna SRP si naci despus de [phone]. Podra tambin necesitar una segunda dosis. Vacuna antineumoccica conjugada (PCV13)  Puede necesitar esta vacuna si tiene determinadas enfermedades y no se vacun anteriormente. [place] antineumoccica de polisacridos (PPSV23)  Quizs tenga que aplicarse una o dos dosis si fuma o si tiene determinadas afecciones. [place] antimeningoccica conjugada (MenACWY)  Puede necesitar esta vacuna si tiene determinadas afecciones. Vacuna contra la hepatitis A  Es posible que necesite esta vacuna si tiene ciertas afecciones o si viaja o trabaja en lugares en los que podra estar expuesta a la hepatitis A. Vacuna contra la hepatitis B  Es posible que necesite esta vacuna si tiene ciertas afecciones o si viaja o trabaja en lugares en los que podra estar expuesta a la hepatitis B. Vacuna antihaemophilus influenzae tipo B (Hib)  Puede necesitar esta vacuna si tiene determinadas afecciones. Vacuna contra el virus del papiloma humano (VPH)  Si el mdico se lo recomienda, Research scientist (physical sciences) tres dosis a lo largo de 6 meses. Puede recibir las vacunas en forma de dosis individuales o en forma de dos o ms vacunas juntas en la misma inyeccin (vacunas combinadas). Hable con su mdico sobre  los riesgos y [place] vacunas combinadas. Qu pruebas necesito? Anlisis de [employer] de lpidos y colesterol. Estos se pueden verificar cada 5 aos o, con ms frecuencia, si usted tiene ms de 40 aos de edad.  Anlisis de hepatitisC.  Anlisis de  hepatitisB. Pruebas de deteccin  Pruebas de deteccin de cncer de pulmn. Es posible que se le realice esta prueba de deteccin a partir de los 58 aos de edad, si ha fumado durante 30 aos un paquete diario y sigue fumando o dej el hbito en algn momento en los ltimos 15 aos.  Pruebas de Programme researcher, broadcasting/film/video de Surveyor, minerals. Todos los adultos a partir de los 58 aos de edad y [place] 58 aos de edad deben hacerse esta prueba de deteccin. El mdico puede recomendarle las pruebas de deteccin a partir de los 34 aos de edad si corre un mayor riesgo. Le realizarn pruebas cada 1 a 10 aos, segn los [place] y el tipo de prueba de Programme researcher, broadcasting/film/video.  Pruebas de deteccin de la diabetes. Esto se Set designer un control del azcar en la sangre (glucosa) despus de no haber comido durante un periodo de tiempo (ayuno). Es posible que se le realice esta prueba cada 1 a 3 aos.  Mamografa. Se puede realizar cada 1 o 2 aos. Hable con su mdico sobre cundo debe comenzar a Engineer, manufacturing de [place] regular. Esto depende de si tiene antecedentes familiares de cncer de mama o no.  Pruebas de deteccin de cncer relacionado con las mutaciones del BRCA. Es posible que se las deba realizar si tiene antecedentes de cncer de mama, de ovario, de trompas o peritoneal.  Examen plvico y prueba de Papanicolaou. Esto se puede realizar cada 96aos a Renato Gails de los 58aos de edad. A partir de los 30 aos, esto se puede Optometrist cada 5 aos si usted se realiza una prueba de Papanicolaou en combinacin con una prueba de deteccin del virus del papiloma humano (VPH). Otras pruebas  Anlisis de enfermedades de transmisin sexual (ETS).  Densitometra sea. Esto se realiza para detectar osteoporosis. Se le puede realizar este examen de deteccin si tiene un riesgo alto de tener osteoporosis. Siga estas instrucciones en su casa: Comida y bebida  Siga una dieta que incluya frutas frescas y verduras, cereales  integrales, lcteos descremados y protenas magras.  Tome los suplementos vitamnicos y [employer] se lo haya indicado el mdico.  No beba alcohol si: ? Su mdico le indica no hacerlo. ? Est embarazada, puede estar embarazada o est tratando de quedar embarazada.  Si bebe alcohol: ? Limite la cantidad que consume de 0 a 1 medida por da. ? Est atenta a la cantidad de alcohol que hay en las bebidas que toma. En los [place], una medida equivale a una botella de cerveza de 12oz (384m), un vaso de vino de 5oz (1468m o un vaso de una bebida alcohlica de alta graduacin de 1oz (4459m Estilo de vidNavistar International Corporationlas encas a diario.  Mantngase activa. Haga al menos 10m39mos de ejercicio 5o ms das Hilton Hotelso consuma ningn producto que contenga nicotina o tabaco, como cigarrillos, cigarrillos electrnicos y tabaco de mascHigher education careers adviser necesita ayuda para dejar de fumar, consulte al mdico.  Si es sexualmente activa, practique sexo seguro. Use un condn u otra forma de mtodo anticonceptivo (anticonceptivos) a fin de evitEnvironmental health practitionerTS (infecciones de transmisin sexual).  Si el mdico se lo indic, tome una dosis  baja de aspirina diariamente a partir de los 28 aos de [place]. Cundo volver?  Visite al mdico una vez al ao para una visita de control.  Pregntele al mdico con qu frecuencia debe realizarse un control de la vista y los dientes.  Mantenga su esquema de vacunacin al da. Esta informacin no tiene Marine scientist el consejo del mdico. Asegrese de hacerle al mdico cualquier pregunta que tenga. Document Revised: 02/14/2018 Document Reviewed: 02/14/2018 Elsevier Patient Education  [place].

## 2019-06-17 ENCOUNTER — Telehealth: Payer: Self-pay | Admitting: Internal Medicine

## 2019-06-17 LAB — COMPREHENSIVE METABOLIC PANEL
ALT: 29 IU/L (ref 0–32)
AST: 24 IU/L (ref 0–40)
Albumin/Globulin Ratio: 2 (ref 1.2–2.2)
Albumin: 4.8 g/dL (ref 3.8–4.9)
Alkaline Phosphatase: 88 IU/L (ref 39–117)
BUN/Creatinine Ratio: 21 (ref 9–23)
BUN: 13 mg/dL (ref 6–24)
Bilirubin Total: 0.3 mg/dL (ref 0.0–1.2)
CO2: 22 mmol/L (ref 20–29)
Calcium: 9.9 mg/dL (ref 8.7–10.2)
Chloride: 106 mmol/L (ref 96–106)
Creatinine, Ser: 0.62 mg/dL (ref 0.57–1.00)
GFR calc Af Amer: 116 mL/min/{1.73_m2} (ref >59)
GFR calc non Af Amer: 100 mL/min/{1.73_m2} (ref >59)
Globulin, Total: 2.4 g/dL (ref 1.5–4.5)
Glucose: 110 mg/dL — ABNORMAL HIGH (ref 65–99)
Potassium: 4.8 mmol/L (ref 3.5–5.2)
Sodium: 142 mmol/L (ref 134–144)
Total Protein: 7.2 g/dL (ref 6.0–8.5)

## 2019-06-17 LAB — MICROALBUMIN / CREATININE URINE RATIO
Creatinine, Urine: 25.5 mg/dL
Microalb/Creat Ratio: <12 mg/g creat (ref 0–29)
Microalbumin, Urine: <3 ug/mL

## 2019-06-17 LAB — CBC WITH DIFFERENTIAL/PLATELET
Basophils Absolute: 0 10*3/uL (ref 0.0–0.2)
Basos: 1 %
EOS (ABSOLUTE): 0.1 10*3/uL (ref 0.0–0.4)
Eos: 2 %
Hematocrit: 43.9 % (ref 34.0–46.6)
Hemoglobin: 15.1 g/dL (ref 11.1–15.9)
Immature Grans (Abs): 0 10*3/uL (ref 0.0–0.1)
Immature Granulocytes: 0 %
Lymphocytes Absolute: 1.2 10*3/uL (ref 0.7–3.1)
Lymphs: 23 %
MCH: 29.8 pg (ref 26.6–33.0)
MCHC: 34.4 g/dL (ref 31.5–35.7)
MCV: 87 fL (ref 79–97)
Monocytes Absolute: 0.3 10*3/uL (ref 0.1–0.9)
Monocytes: 6 %
Neutrophils Absolute: 3.6 10*3/uL (ref 1.4–7.0)
Neutrophils: 68 %
Platelets: 288 10*3/uL (ref 150–450)
RBC: 5.06 x10E6/uL (ref 3.77–5.28)
RDW: 13.4 % (ref 11.7–15.4)
WBC: 5.3 10*3/uL (ref 3.4–10.8)

## 2019-06-17 LAB — LIPID PANEL
Chol/HDL Ratio: 2.8 ratio (ref 0.0–4.4)
Cholesterol, Total: 163 mg/dL (ref 100–199)
HDL: 58 mg/dL (ref >39)
LDL Chol Calc (NIH): 89 mg/dL (ref 0–99)
Triglycerides: 86 mg/dL (ref 0–149)
VLDL Cholesterol Cal: 16 mg/dL (ref 5–40)

## 2019-06-17 LAB — HEMOGLOBIN A1C
Est. average glucose Bld gHb Est-mCnc: 272 mg/dL
Hgb A1c MFr Bld: 11.1 % — ABNORMAL HIGH (ref 4.8–5.6)

## 2019-06-17 NOTE — Telephone Encounter (Signed)
Pt saw Dr. Dione Booze and did not like him and is going back to [facility]. Nile Riggs does not take pt insurance and pt was advised but will call them

## 2019-06-27 ENCOUNTER — Other Ambulatory Visit: Payer: Self-pay

## 2019-06-30 ENCOUNTER — Encounter: Payer: Self-pay | Admitting: Obstetrics and Gynecology

## 2019-06-30 ENCOUNTER — Ambulatory Visit (INDEPENDENT_AMBULATORY_CARE_PROVIDER_SITE_OTHER): Payer: 59 | Admitting: Obstetrics and Gynecology

## 2019-06-30 ENCOUNTER — Other Ambulatory Visit: Payer: Self-pay

## 2019-06-30 VITALS — BP 120/74 | Ht 62.0 in | Wt 167.0 lb

## 2019-06-30 DIAGNOSIS — N841 Polyp of cervix uteri: Secondary | ICD-10-CM | POA: Diagnosis not present

## 2019-06-30 NOTE — Progress Notes (Signed)
   Traci Young Traci Young  1961/01/25 [id number]  HPI The patient is a 59 y.o. G4P4 who is referred for an abnormal finding on her recent exam per her primary provider.  Her Pap smear was normal on 05/19/2019 with a negative HPV.  I do not see in the progress note as to what exactly the abnormal finding was.  She has no pelvic pain, vaginal bleeding, or discharge.   Spanish interpreter is present at the visit today.   Past medical history,surgical history, problem list, medications, allergies, family history and social history were all reviewed and documented as reviewed in the EPIC chart.  ROS:  Feeling well. No dyspnea or chest pain on exertion.  No abdominal pain, change in bowel habits, black or bloody stools.  No urinary tract symptoms. GYN ROS: no abnormal bleeding, pelvic pain or discharge  Physical Exam  BP 120/74   Ht 5\' 2"  (1.575 m)   Wt 167 lb (75.8 kg)   LMP 06/30/2015   BMI 30.54 kg/m   General: Pleasant female, no acute distress, alert and oriented  PELVIC EXAM: VULVA: normal appearing vulva with no masses, tenderness or lesions, VAGINA: normal appearing vagina with normal color and discharge, no lesions, CERVIX: normal appearing cervix without discharge, 1 cm sessile polyp present at 5:00 in the external cervical os  Cervical polypectomy The polyp tissue was grasped with a [place] forceps and twisted several times upon its base.  Multiple grasps of the tissue were necessary to help retrieve it since it was fairly sessile and closely associated with the cervical tissue.  The tissue was hemostatic after the small bit of polyp was removed.  There did appear to be a small fragment of polyp tissue in the specimen cup after the procedure.  The patient tolerated the procedure well without difficulty.  07/02/2015 present for exam and procedure  Assessment 59 y.o. is a G4P4 with a cervical polyp status post removal  Plan With the interpreter present, we discussed the finding  of what appears to be a benign cervical polyp which was removed as much as possible today using basic instruments.  We will have the pathologist evaluate the tissue.  I do not suspect that it is anything other than a benign polyp.  All of her questions were answered to her satisfaction by the end of the visit.   59 MD 06/30/19

## 2019-06-30 NOTE — Patient Instructions (Signed)
We will call you with the pathology result from the cervical polyp removal

## 2019-07-02 LAB — PATHOLOGY REPORT

## 2019-07-02 LAB — TISSUE SPECIMEN

## 2019-07-02 NOTE — Progress Notes (Signed)
Please let Traci Young know that the cervical tissue removed the other day was consistent with a benign cervical polyp.  No further follow-up is needed for this issue other than to get her regularly scheduled Pap smears.

## 2019-07-17 ENCOUNTER — Other Ambulatory Visit: Payer: Self-pay

## 2019-07-17 ENCOUNTER — Ambulatory Visit
Admission: RE | Admit: 2019-07-17 | Discharge: 2019-07-17 | Disposition: A | Discharge status: Home / Self Care | Payer: 59 | Source: Ambulatory Visit | Attending: Family Medicine | Admitting: Family Medicine

## 2019-07-17 DIAGNOSIS — Z1231 Encounter for screening mammogram for malignant neoplasm of breast: Secondary | ICD-10-CM

## 2019-08-27 ENCOUNTER — Ambulatory Visit: Payer: BLUE CROSS/BLUE SHIELD | Admitting: Family Medicine

## 2019-09-15 ENCOUNTER — Ambulatory Visit (INDEPENDENT_AMBULATORY_CARE_PROVIDER_SITE_OTHER): Payer: 59 | Admitting: Family Medicine

## 2019-09-15 ENCOUNTER — Encounter: Payer: Self-pay | Admitting: Family Medicine

## 2019-09-15 VITALS — BP 122/72 | HR 68 | Temp 97.8°F | Wt 175.8 lb

## 2019-09-15 DIAGNOSIS — L237 Allergic contact dermatitis due to plants, except food: Secondary | ICD-10-CM | POA: Diagnosis not present

## 2019-09-15 DIAGNOSIS — E1169 Type 2 diabetes mellitus with other specified complication: Secondary | ICD-10-CM

## 2019-09-15 DIAGNOSIS — E119 Type 2 diabetes mellitus without complications: Secondary | ICD-10-CM | POA: Diagnosis not present

## 2019-09-15 DIAGNOSIS — E785 Hyperlipidemia, unspecified: Secondary | ICD-10-CM

## 2019-09-15 LAB — POCT GLYCOSYLATED HEMOGLOBIN (HGB A1C): Hemoglobin A1C: 5.9 % — AB (ref 4.0–5.6)

## 2019-09-15 MED ORDER — ATORVASTATIN CALCIUM 10 MG PO TABS: 10.0000 mg | ORAL_TABLET | Freq: Every day | ORAL | 1 refills | Status: DC

## 2019-09-15 MED ORDER — METFORMIN HCL 1000 MG PO TABS: 1000.0000 mg | ORAL_TABLET | Freq: Two times a day (BID) | ORAL | 3 refills | Status: DC

## 2019-09-15 MED ORDER — TRIAMCINOLONE ACETONIDE 0.1 % EX CREA
1.0000 | TOPICAL_CREAM | Freq: Two times a day (BID) | CUTANEOUS | 0 refills | Status: DC
Start: 2019-09-15 — End: 2020-04-05

## 2019-09-15 NOTE — Progress Notes (Signed)
   Subjective:    Patient ID: Traci Young, female    DOB: 1961/03/25, 59 y.o.   MRN: [id number]  HPI Chief Complaint  Patient presents with  . Diabetes    diabetes check rash on lt. arm maybe poison oak   She is here to follow up on diabetes. States she has been doing well on Janumet and atorvastatin.   Her FBS today was 114. Seeing readings consistently less than 130.   States she is no longer eating sugar.  Walking and running for exercise.   Eye exam done. New glasses. Saw Dr. Nile Riggs.   States she has had a pruritic rash for the past 5 days. She was working outside and saw poison ivy. She used hydrocortisone but it was not helping.   Covid vaccines- Laural Benes and [place] on August 25, 2019.    Review of Systems Pertinent positives and negatives in the history of present illness.     Objective:   Physical Exam BP 122/72   Pulse 68   Temp 97.8 F (36.6 C)   Wt 175 lb 12.8 oz (79.7 kg)   LMP 06/30/2015   BMI 32.15 kg/m   Pruritic linear rash on left forearm and anterior neck       Assessment & Plan:  Diabetes mellitus, new onset (HCC) - Plan: HgB A1c, metFORMIN (GLUCOPHAGE) 1000 MG tablet  Contact dermatitis due to poison ivy  Hyperlipidemia associated with type 2 diabetes mellitus (HCC) - Plan: atorvastatin (LIPITOR) 10 MG tablet  Hgb A1c 5.9% and significantly improved from 11.1% 3 months ago. She has made very healthy diet changes and increased exercise. Feeling good.  Her blood sugars have responded well.  Discussed stopping Janumet and she will now take metformin only.  Continue statin therapy.  Continue checking BS at home.  Request diabetic eye exam.  Triamcinolone prescribed for rash. Advised to only use for one week.  Check labs and follow up in 6 months or sooner if needed.

## 2019-09-15 NOTE — Patient Instructions (Addendum)
Take metformin 1,000 mg twice daily.  Continue taking atorvastatin once daily.   Keep eating a healthy diet and exercising.   Follow up in 6 months or sooner if your blood sugars start running high.

## 2020-03-22 ENCOUNTER — Ambulatory Visit: Payer: 59 | Admitting: Family Medicine

## 2020-03-22 NOTE — Progress Notes (Deleted)
  Subjective:    Patient ID: Traci Young, female    DOB: 01-15-1961, 59 y.o.   MRN: [id number]  Traci Young is a 59 y.o. female who presents for follow-up of Type 2 diabetes mellitus.  Patient {is/are not:32546} checking home blood sugars.   Home blood sugar records: {dm home sugars:14018} How often is blood sugars being checked: *** Current symptoms include: {dm sx:14075}. Patient denies {dm sx:19199}.  Patient {is/are not:32546} checking their feet daily. Any Foot concerns (callous, ulcer, wound, thickened nails, toenail fungus, skin fungus, hammer toe): *** Last dilated eye exam: ***  Current treatments: {dm interventions:14074}. Medication compliance: {good/fair/poor:33178}  Current diet: {diet habits:16563} Current exercise: {exercise types:16438} Known diabetic complications: {diabetes complications:1215}  The following portions of the patient's history were reviewed and updated as appropriate: allergies, current medications, past medical history, past social history and problem list.  ROS as in subjective above.     Objective:    Physical Exam Alert and in no distress otherwise not examined.  Last menstrual period 06/30/2015.  Lab Review Diabetic Labs Latest Ref Rng & Units 09/15/2019 06/16/2019 05/19/2019 08/13/2017 04/25/2016  HbA1c 4.0 - 5.6 % 5.9(A) 11.1(H) 14.0(A) - 5.5  Micro/Creat Ratio 0 - 29 mg/g creat - <12 - - -  Chol 100 - 199 mg/dL - 643 - 329 518(A)  HDL >39 mg/dL - 58 - 45 41(Y)  Calc LDL 0 - 99 mg/dL - 89 - 606(T) 016(W)  Triglycerides 0 - 149 mg/dL - 86 - 109 323(F)  Creatinine 0.57 - 1.00 mg/dL - 5.73 2.20 2.54(Y) 7.06   BP/Weight 09/15/2019 06/30/2019 06/16/2019 06/03/2019 05/21/2019  Systolic BP 122 120 120 124 110  Diastolic BP 72 74 70 80 70  Wt. (Lbs) 175.8 167 165.8 164.8 162.4  BMI 32.15 30.54 29.84 30.14 29.7   Foot/eye exam completion dates 06/16/2019  Foot Form Completion Done    Traci Young  reports that she has quit  smoking. Her smoking use included cigarettes. She has a 1.50 pack-year smoking history. She has never used smokeless tobacco. She reports that she does not drink alcohol and does not use drugs.     Assessment & Plan:    Controlled type 2 diabetes mellitus with complication, without long-term current use of insulin (HCC)  Hyperlipidemia associated with type 2 diabetes mellitus (HCC)  1. Rx changes: {none:33079} 2. Education: Reviewed 'ABCs' of diabetes management (respective goals in parentheses):  A1C (<7), blood pressure (<130/80), and cholesterol (LDL <100). 3. Compliance at present is estimated to be {good/fair/poor:33178}. Efforts to improve compliance (if necessary) will be directed at {compliance:16716}. 4. Follow up: {NUMBERS; 0-10:33138} {time:11}

## 2020-03-24 ENCOUNTER — Encounter: Payer: Self-pay | Admitting: Family Medicine

## 2020-04-05 ENCOUNTER — Other Ambulatory Visit: Payer: Self-pay

## 2020-04-05 ENCOUNTER — Ambulatory Visit (INDEPENDENT_AMBULATORY_CARE_PROVIDER_SITE_OTHER): Payer: 59 | Admitting: Family Medicine

## 2020-04-05 ENCOUNTER — Encounter: Payer: Self-pay | Admitting: Family Medicine

## 2020-04-05 VITALS — BP 120/70 | HR 96 | Wt 180.0 lb

## 2020-04-05 DIAGNOSIS — E1169 Type 2 diabetes mellitus with other specified complication: Secondary | ICD-10-CM | POA: Diagnosis not present

## 2020-04-05 DIAGNOSIS — E119 Type 2 diabetes mellitus without complications: Secondary | ICD-10-CM

## 2020-04-05 DIAGNOSIS — Z789 Other specified health status: Secondary | ICD-10-CM

## 2020-04-05 DIAGNOSIS — E785 Hyperlipidemia, unspecified: Secondary | ICD-10-CM

## 2020-04-05 DIAGNOSIS — H9312 Tinnitus, left ear: Secondary | ICD-10-CM | POA: Diagnosis not present

## 2020-04-05 DIAGNOSIS — Z23 Encounter for immunization: Secondary | ICD-10-CM

## 2020-04-05 LAB — POCT GLYCOSYLATED HEMOGLOBIN (HGB A1C): Hemoglobin A1C: 6.4 % — AB (ref 4.0–5.6)

## 2020-04-05 LAB — LIPID PANEL

## 2020-04-05 MED ORDER — METFORMIN HCL 1000 MG PO TABS: 1000.0000 mg | ORAL_TABLET | Freq: Two times a day (BID) | ORAL | 3 refills | Status: DC

## 2020-04-05 MED ORDER — ATORVASTATIN CALCIUM 10 MG PO TABS: 10.0000 mg | ORAL_TABLET | Freq: Every day | ORAL | 1 refills | Status: DC

## 2020-04-05 NOTE — Progress Notes (Signed)
Subjective:    Patient ID: Traci Young, female    DOB: Feb 25, 1961, 59 y.o.   MRN: [id number]  Tara Rud Haniyyah Sakuma is a 59 y.o. female who presents for follow-up of Type 2 diabetes mellitus.  She ran out of Metformin and atorvastatin 2 months ago but did not let me know.   Left ear with intermittent ringing.   She has been feeling more forgetful for the past 3 months. Nothing worrisome.   Patient is checking home blood sugars.   Home blood sugar records: BGs range between 198 and 200's How often is blood sugars being checked: every 2 weeks Current symptoms include: none. Patient denies increased appetite, nausea, visual disturbances, vomiting and weight loss.  Patient is checking their feet daily. Any Foot concerns (callous, ulcer, wound, thickened nails, toenail fungus, skin fungus, hammer toe): none Last dilated eye exam: not sure  Current treatments: doing well on dm meds but ran out  Medication compliance: good until she ran out  Current diet: in general, a "healthy" diet   Current exercise: walking Known diabetic complications: none  The following portions of the patient's history were reviewed and updated as appropriate: allergies, current medications, past medical history, past social history and problem list.  ROS as in subjective above.     Objective:    Physical Exam Alert and in no distress. Tympanic membranes and canals are normal. Neck is supple without adenopathy or thyromegaly. Cardiac exam shows a regular sinus rhythm without murmurs or gallops. Lungs are clear to auscultation.   Blood pressure 120/70, pulse 96, weight 180 lb (81.6 kg), last menstrual period 06/30/2015.  Lab Review Diabetic Labs Latest Ref Rng & Units 04/05/2020 09/15/2019 06/16/2019 05/19/2019 08/13/2017  HbA1c 4.0 - 5.6 % 6.4(A) 5.9(A) 11.1(H) 14.0(A) -  Micro/Creat Ratio 0 - 29 mg/g creat - - <12 - -  Chol 100 - 199 mg/dL - - 628 - 315  HDL >17 mg/dL - - 58 - 45  Calc  LDL 0 - 99 mg/dL - - 89 - 616(W)  Triglycerides 0 - 149 mg/dL - - 86 - 737  Creatinine 0.57 - 1.00 mg/dL - - 1.06 2.69 4.85(I)   BP/Weight 04/05/2020 09/15/2019 06/30/2019 06/16/2019 06/03/2019  Systolic BP 120 122 120 120 124  Diastolic BP 70 72 74 70 80  Wt. (Lbs) 180 175.8 167 165.8 164.8  BMI 32.92 32.15 30.54 29.84 30.14   Foot/eye exam completion dates 06/16/2019  Foot Form Completion Done    Kaiana  reports that she has quit smoking. Her smoking use included cigarettes. She has a 1.50 pack-year smoking history. She has never used smokeless tobacco. She reports that she does not drink alcohol and does not use drugs.     Assessment & Plan:    Diabetes mellitus, new onset (HCC) - Plan: HgB A1c, CBC with Differential/Platelet, Comprehensive metabolic panel, TSH, T4, free, Vitamin B12, metFORMIN (GLUCOPHAGE) 1000 MG tablet  Hyperlipidemia associated with type 2 diabetes mellitus (HCC) - Plan: Lipid panel, atorvastatin (LIPITOR) 10 MG tablet  Language barrier  Tinnitus, left  Needs flu shot - Plan: Flu Vaccine QUAD 36+ mos IM  COVID-19 vaccine administered - Plan: Moderna SARS-CoV-2 Vaccine  1. Rx changes: none  Hgb A1c 6.4% recommend she start back on her medications and let me know if she is about to run out in the future instead of stopping her medication.  2. Education: Reviewed 'ABCs' of diabetes management (respective goals in parentheses):  A1C (<7), blood pressure (<  130/80), and cholesterol (LDL <100). 3. Compliance at present is estimated to be fair. Efforts to improve compliance (if necessary) will be directed at dietary modifications: low sugar and low carbohydrate, increased exercise and regular blood sugar monitoring: 2-3 times weekly. 4. HL- start back on statin. Check lipids and follow up  Counseling on flu and Covid booster vaccines and possible side effects.  5. Memory concern? 6. Follow up: 4 months

## 2020-04-05 NOTE — Patient Instructions (Signed)
Start back on your medications.

## 2020-04-06 LAB — COMPREHENSIVE METABOLIC PANEL
ALT: 44 IU/L — ABNORMAL HIGH (ref 0–32)
AST: 31 IU/L (ref 0–40)
Albumin/Globulin Ratio: 1.8 (ref 1.2–2.2)
Albumin: 4.9 g/dL (ref 3.8–4.9)
Alkaline Phosphatase: 92 IU/L (ref 44–121)
BUN/Creatinine Ratio: 24 — ABNORMAL HIGH (ref 9–23)
BUN: 12 mg/dL (ref 6–24)
Bilirubin Total: 0.2 mg/dL (ref 0.0–1.2)
CO2: 20 mmol/L (ref 20–29)
Calcium: 9.4 mg/dL (ref 8.7–10.2)
Chloride: 99 mmol/L (ref 96–106)
Creatinine, Ser: 0.5 mg/dL — ABNORMAL LOW (ref 0.57–1.00)
GFR calc Af Amer: 123 mL/min/{1.73_m2} (ref >59)
GFR calc non Af Amer: 107 mL/min/{1.73_m2} (ref >59)
Globulin, Total: 2.7 g/dL (ref 1.5–4.5)
Glucose: 290 mg/dL — ABNORMAL HIGH (ref 65–99)
Potassium: 4 mmol/L (ref 3.5–5.2)
Sodium: 136 mmol/L (ref 134–144)
Total Protein: 7.6 g/dL (ref 6.0–8.5)

## 2020-04-06 LAB — CBC WITH DIFFERENTIAL/PLATELET
Basophils Absolute: 0.1 10*3/uL (ref 0.0–0.2)
Basos: 1 %
EOS (ABSOLUTE): 0.2 10*3/uL (ref 0.0–0.4)
Eos: 3 %
Hematocrit: 41.6 % (ref 34.0–46.6)
Hemoglobin: 14.5 g/dL (ref 11.1–15.9)
Immature Grans (Abs): 0 10*3/uL (ref 0.0–0.1)
Immature Granulocytes: 0 %
Lymphocytes Absolute: 1.6 10*3/uL (ref 0.7–3.1)
Lymphs: 26 %
MCH: 29.5 pg (ref 26.6–33.0)
MCHC: 34.9 g/dL (ref 31.5–35.7)
MCV: 85 fL (ref 79–97)
Monocytes Absolute: 0.4 10*3/uL (ref 0.1–0.9)
Monocytes: 6 %
Neutrophils Absolute: 3.9 10*3/uL (ref 1.4–7.0)
Neutrophils: 64 %
Platelets: 309 10*3/uL (ref 150–450)
RBC: 4.91 x10E6/uL (ref 3.77–5.28)
RDW: 12.4 % (ref 11.7–15.4)
WBC: 6.1 10*3/uL (ref 3.4–10.8)

## 2020-04-06 LAB — LIPID PANEL
Chol/HDL Ratio: 4.6 ratio — ABNORMAL HIGH (ref 0.0–4.4)
Cholesterol, Total: 194 mg/dL (ref 100–199)
HDL: 42 mg/dL
LDL Chol Calc (NIH): 96 mg/dL (ref 0–99)
Triglycerides: 337 mg/dL — ABNORMAL HIGH (ref 0–149)
VLDL Cholesterol Cal: 56 mg/dL — ABNORMAL HIGH (ref 5–40)

## 2020-04-06 LAB — VITAMIN B12: Vitamin B-12: 944 pg/mL (ref 232–1245)

## 2020-04-06 LAB — TSH: TSH: 1.57 u[IU]/mL (ref 0.450–4.500)

## 2020-04-06 LAB — T4, FREE: Free T4: 1.17 ng/dL (ref 0.82–1.77)

## 2020-04-07 NOTE — Progress Notes (Signed)
Her blood work shows that she needs to be back on her diabetes medication and cholesterol medication.  Please make sure she has picked this up from the pharmacy and started back on it.  Her blood sugar was 290 at her visit.  Also, one of her liver tests was slightly elevated and it has been in the past. I recommend eating a low fat diet and avoid alcohol to help her liver function.  Please ask her to come in for her follow-up visit fasting.  Explain what this means.  I also would like a medical interpreter to be present at this visit.

## 2020-05-24 ENCOUNTER — Ambulatory Visit (INDEPENDENT_AMBULATORY_CARE_PROVIDER_SITE_OTHER): Payer: 59 | Admitting: Family Medicine

## 2020-05-24 ENCOUNTER — Other Ambulatory Visit: Payer: Self-pay

## 2020-05-24 ENCOUNTER — Encounter: Payer: Self-pay | Admitting: Family Medicine

## 2020-05-24 VITALS — BP 120/80 | HR 82 | Wt 177.8 lb

## 2020-05-24 DIAGNOSIS — E1169 Type 2 diabetes mellitus with other specified complication: Secondary | ICD-10-CM

## 2020-05-24 DIAGNOSIS — R7401 Elevation of levels of liver transaminase levels: Secondary | ICD-10-CM

## 2020-05-24 DIAGNOSIS — E785 Hyperlipidemia, unspecified: Secondary | ICD-10-CM

## 2020-05-24 DIAGNOSIS — E781 Pure hyperglyceridemia: Secondary | ICD-10-CM | POA: Diagnosis not present

## 2020-05-24 NOTE — Progress Notes (Signed)
   Subjective:    Patient ID: Traci Young, female    DOB: 30-May-1960, 60 y.o.   MRN: [id number]  HPI Chief Complaint  Patient presents with  . follow-up on    Follow-up on cholesterol. Ate around 8am eggs and spinach and water   Information obtained via [facility] health medical interpreter.  She is here to follow up on diabetes, HL, and elevated ALT. At her previous visit she had been out of Metformin and her statin for a week or 2.  Her blood sugar was 290. DM-reports taking Metformin daily.  No concerns.  States her blood sugars are now in the 120s and 130s. Hemoglobin A1c 6.4% on April 05, 2020.  Hyperlipidemia with LDL 96 and triglycerides 337.  ALT mildly elevated and has been in the past. Denies drinking alcohol.  No regular NSAIDs  Eye exam done in February 2021. Traci Young care.   Denies fever, chills, chest pain, palpitations, shortness of breath, abdominal pain, nausea, vomiting or diarrhea, urinary symptoms and LE edema.  No new concerns.  Reviewed allergies, medications, past medical, surgical, family, and social history.    Review of Systems Pertinent positives and negatives in the history of present illness.     Objective:   Physical Exam BP 120/80   Pulse 82   Wt 177 lb 12.8 oz (80.6 kg)   LMP 06/30/2015   BMI 32.52 kg/m   Alert and oriented and in no acute distress.  Respirations unlabored.  Normal mood.      Assessment & Plan:  Hyperlipidemia associated with type 2 diabetes mellitus (HCC) - Plan: Lipid panel  Hypertriglyceridemia - Plan: Lipid panel  Elevated ALT measurement - Plan: Comprehensive metabolic panel  She appears to be taking her medications daily with no side effects.  Reviewed labs with patient. She will continue on current medications.  Encouraged her to let me know before she is about to run out of her medication so that I can refill it and there will be any lapses. I will check labs and follow-up. Return in  3 months for CPE and diabetes follow-up.  Consider starting her on an ACE inhibitor.  Unclear as to why she is not currently taking one or if she has in the past.

## 2020-05-25 ENCOUNTER — Other Ambulatory Visit: Payer: Self-pay

## 2020-05-25 DIAGNOSIS — E785 Hyperlipidemia, unspecified: Secondary | ICD-10-CM

## 2020-05-25 DIAGNOSIS — E1169 Type 2 diabetes mellitus with other specified complication: Secondary | ICD-10-CM

## 2020-05-25 LAB — COMPREHENSIVE METABOLIC PANEL
ALT: 43 IU/L — ABNORMAL HIGH (ref 0–32)
AST: 35 IU/L (ref 0–40)
Albumin/Globulin Ratio: 2 (ref 1.2–2.2)
Albumin: 4.9 g/dL (ref 3.8–4.9)
Alkaline Phosphatase: 86 IU/L (ref 44–121)
BUN/Creatinine Ratio: 25 — ABNORMAL HIGH (ref 9–23)
BUN: 15 mg/dL (ref 6–24)
Bilirubin Total: 0.3 mg/dL (ref 0.0–1.2)
CO2: 23 mmol/L (ref 20–29)
Calcium: 9.9 mg/dL (ref 8.7–10.2)
Chloride: 98 mmol/L (ref 96–106)
Creatinine, Ser: 0.59 mg/dL (ref 0.57–1.00)
GFR calc Af Amer: 117 mL/min/{1.73_m2} (ref >59)
GFR calc non Af Amer: 101 mL/min/{1.73_m2} (ref >59)
Globulin, Total: 2.4 g/dL (ref 1.5–4.5)
Glucose: 92 mg/dL (ref 65–99)
Potassium: 4.3 mmol/L (ref 3.5–5.2)
Sodium: 140 mmol/L (ref 134–144)
Total Protein: 7.3 g/dL (ref 6.0–8.5)

## 2020-05-25 LAB — LIPID PANEL
Chol/HDL Ratio: 3.9 ratio (ref 0.0–4.4)
Cholesterol, Total: 185 mg/dL (ref 100–199)
HDL: 48 mg/dL (ref >39)
LDL Chol Calc (NIH): 108 mg/dL — ABNORMAL HIGH (ref 0–99)
Triglycerides: 168 mg/dL — ABNORMAL HIGH (ref 0–149)
VLDL Cholesterol Cal: 29 mg/dL (ref 5–40)

## 2020-05-25 MED ORDER — ATORVASTATIN CALCIUM 20 MG PO TABS: 20.0000 mg | ORAL_TABLET | Freq: Every day | ORAL | 1 refills | Status: DC

## 2020-05-25 NOTE — Progress Notes (Signed)
Her blood sugar is much better and her liver enzyme is stable. Her cholesterol is not in goal range so I recommend that we increase her dose of atorvastatin from 10 mg to 20 mg. Please send in the new prescription for her to take once daily #90 with 1 refill. She can take 2 of the 10 mg tablets of her atorvastatin until they are gone so she does not waste them if she prefers.

## 2020-07-13 ENCOUNTER — Encounter: Payer: 59 | Admitting: Family Medicine

## 2020-08-02 ENCOUNTER — Encounter: Payer: Self-pay | Admitting: Family Medicine

## 2020-08-02 ENCOUNTER — Other Ambulatory Visit: Payer: Self-pay

## 2020-08-02 ENCOUNTER — Ambulatory Visit (INDEPENDENT_AMBULATORY_CARE_PROVIDER_SITE_OTHER): Payer: 59 | Admitting: Family Medicine

## 2020-08-02 VITALS — BP 110/70 | HR 75 | Ht 62.25 in | Wt 177.2 lb

## 2020-08-02 DIAGNOSIS — E119 Type 2 diabetes mellitus without complications: Secondary | ICD-10-CM | POA: Diagnosis not present

## 2020-08-02 DIAGNOSIS — R7401 Elevation of levels of liver transaminase levels: Secondary | ICD-10-CM

## 2020-08-02 DIAGNOSIS — Z0000 Encounter for general adult medical examination without abnormal findings: Secondary | ICD-10-CM | POA: Diagnosis not present

## 2020-08-02 DIAGNOSIS — E2839 Other primary ovarian failure: Secondary | ICD-10-CM | POA: Diagnosis not present

## 2020-08-02 DIAGNOSIS — E1169 Type 2 diabetes mellitus with other specified complication: Secondary | ICD-10-CM | POA: Diagnosis not present

## 2020-08-02 DIAGNOSIS — E785 Hyperlipidemia, unspecified: Secondary | ICD-10-CM

## 2020-08-02 NOTE — Progress Notes (Signed)
Subjective:    Patient ID: Traci Young, female    DOB: 1960/05/29, 60 y.o.   MRN: [id number]  HPI Chief Complaint  Patient presents with  . fasting cpe    Fasting cpe . Doesn't see obgyn. Last pap was 2021   She is here for a complete physical exam and to follow-up on chronic health conditions including diabetes.  Other providers: Dr. Darrick Penna- vascular surgery  (varicose veins)  Dr. Nile Riggs - eyes  Dr. Myrtie Neither- GI  Dr. Graciela Husbands- Cardiology   DM- taking metformin daily with no issues.  Blood sugars at home are reportedly in normal range. Reports healthy, well-balanced diet low in sugar.  She gets "some" exercise.  Social history: Lives with family, works Education officer, environmental houses. Denies smoking, drinking alcohol, drug use    Immunizations: Up-to-date except for Shingrix which we discussed.  Health maintenance:  Mammogram: March 2021 Colonoscopy: May 2019 Last Gynecological Exam: in the past 12 months  DEXA: never  Last Dental Exam: in the past year  Last Eye Exam: recent   Wears seatbelt always, uses sunscreen, smoke detectors in home and functioning, does not text while driving and feels safe in home environment.   Reviewed allergies, medications, past medical, surgical, family, and social history.    Review of Systems Review of Systems Constitutional: -fever, -chills, -sweats, -unexpected weight change,-fatigue ENT: -runny nose, -ear pain, -sore throat Cardiology:  -chest pain, -palpitations, -edema Respiratory: -cough, -shortness of breath, -wheezing Gastroenterology: -abdominal pain, -nausea, -vomiting, -diarrhea, -constipation  Hematology: -bleeding or bruising problems Musculoskeletal: -arthralgias, -myalgias, -joint swelling, -back pain Ophthalmology: -vision changes Urology: -dysuria, -difficulty urinating, -hematuria, -urinary frequency, -urgency Neurology: -headache, -weakness, -tingling, -numbness       Objective:   Physical Exam BP 110/70    Pulse 75   Ht 5' 2.25" (1.581 m)   Wt 177 lb 3.2 oz (80.4 kg)   LMP 06/30/2015   BMI 32.15 kg/m   General Appearance:    Alert, cooperative, no distress, appears stated age  Head:    Normocephalic, without obvious abnormality, atraumatic  Eyes:    PERRL, conjunctiva/corneas clear, EOM's intact  Ears:    Normal TM's and external ear canals  Nose:   Mask on   Throat:   Mask on   Neck:   Supple, no lymphadenopathy;  thyroid:  no   enlargement/tenderness/nodules; no JVD  Back:    Spine nontender, no curvature, ROM normal, no CVA     tenderness  Lungs:     Clear to auscultation bilaterally without wheezes, rales or     ronchi; respirations unlabored  Chest Wall:    No tenderness or deformity   Heart:    Regular rate and rhythm, S1 and S2 normal, no murmur, rub   or gallop  Breast Exam:    Declines. OB/GYN  Abdomen:     Soft, non-tender, nondistended, normoactive bowel sounds,    no masses, no hepatosplenomegaly  Genitalia:    Declines has OB/GYN     Extremities:   No clubbing, cyanosis or edema  Pulses:   2+ and symmetric all extremities  Skin:   Skin color, texture, turgor normal, no rashes or lesions  Lymph nodes:   Cervical, supraclavicular, and axillary nodes normal  Neurologic:   CNII-XII intact, normal strength, sensation and gait          Psych:   Normal mood, affect, hygiene and grooming.        Assessment & Plan:  Routine general medical examination  at a health care facility - Plan: CBC with Differential/Platelet, Comprehensive metabolic panel, TSH, T4, free She declines a medical interpreter today and states her English has improved since last year. -She is here for a fasting CPE.  Preventive health care reviewed and she is up-to-date.  Counseled on healthy lifestyle including diet and exercise.  Recommend that she get at least 150 minutes of physical activity each week.  Recommend regular dental and eye exams.  Immunizations reviewed.  She may return for a Shingrix vaccine  but I encouraged her to check with her insurance regarding cost. Discussed safety and health promotion.  Hyperlipidemia associated with type 2 diabetes mellitus (HCC) - Plan: Lipid panel -Continue statin therapy.  Follow-up pending lipid panel results  Elevated ALT measurement - Plan: Comprehensive metabolic panel -She will continue to avoid alcohol and regular NSAID use.  Follow-up pending liver function test.  Consider abdominal ultrasound if needed.  Estrogen deficiency - Plan: DG Bone Density -This will be her first bone density study.  Controlled type 2 diabetes mellitus without complication, without long-term current use of insulin (HCC) - Plan: Microalbumin / creatinine urine ratio, TSH, T4, free, Hemoglobin A1c -Diabetes has been controlled and she reports good blood sugars at home.  Continue Metformin.  Continue checking her blood sugar at home.  Recommend low sugar, low carbohydrate diet and getting plenty of physical activity.  She is on a statin.  Diabetic eye exam done last year and I will request this from Dr. Ashley Royalty office.  Urine microalbumin done today.  Foot exam done today.

## 2020-08-02 NOTE — Patient Instructions (Addendum)
Call and schedule your mammogram and bone density at [facility].   Continue your current medication. Do not run out, let us know and we will refill the medication.   You can get a Shingrix vaccine if you would like. Check with your insurance to see how much this will cost you and you can schedule a nurse visit to get it.   Follow up pending lab results and in 6 months.    Preventive Care 1-59 Years Old, Female Preventive care refers to lifestyle choices and visits with your health care provider that can promote health and wellness. This includes:  A yearly physical exam. This is also called an annual wellness visit.  Regular dental and eye exams.  Immunizations.  Screening for certain conditions.  Healthy lifestyle choices, such as: ? Eating a healthy diet. ? Getting regular exercise. ? Not using drugs or products that contain nicotine and tobacco. ? Limiting alcohol use. What can I expect for my preventive care visit? Physical exam Your health care provider will check your:  Height and weight. These may be used to calculate your BMI (body mass index). BMI is a measurement that tells if you are at a healthy weight.  Heart rate and blood pressure.  Body temperature.  Skin for abnormal spots. Counseling Your health care provider may ask you questions about your:  Past medical problems.  Family's medical history.  Alcohol, tobacco, and drug use.  Emotional well-being.  Home life and relationship well-being.  Sexual activity.  Diet, exercise, and sleep habits.  Work and work Statistician.  Access to firearms.  Method of birth control.  Menstrual cycle.  Pregnancy history. What immunizations do I need? Vaccines are usually given at various ages, according to a schedule. Your health care provider will recommend vaccines for you based on your age, medical history, and lifestyle or other factors, such as travel or where you work.   What tests do I  need? Blood tests  Lipid and cholesterol levels. These may be checked every 5 years, or more often if you are over 34 years old.  Hepatitis C test.  Hepatitis B test. Screening  Lung cancer screening. You may have this screening every year starting at age 63 if you have a 30-pack-year history of smoking and currently smoke or have quit within the past 15 years.  Colorectal cancer screening. ? All adults should have this screening starting at age 59 and continuing until age 73. ? Your health care provider may recommend screening at age 64 if you are at increased risk. ? You will have tests every 1-10 years, depending on your results and the type of screening test.  Diabetes screening. ? This is done by checking your blood sugar (glucose) after you have not eaten for a while (fasting). ? You may have this done every 1-3 years.  Mammogram. ? This may be done every 1-2 years. ? Talk with your health care provider about when you should start having regular mammograms. This may depend on whether you have a family history of breast cancer.  BRCA-related cancer screening. This may be done if you have a family history of breast, ovarian, tubal, or peritoneal cancers.  Pelvic exam and Pap test. ? This may be done every 3 years starting at age 34. ? Starting at age 30, this may be done every 5 years if you have a Pap test in combination with an HPV test. Other tests  STD (sexually transmitted disease) testing, if you  are at risk.  Bone density scan. This is done to screen for osteoporosis. You may have this scan if you are at high risk for osteoporosis. Talk with your health care provider about your test results, treatment options, and if necessary, the need for more tests. Follow these instructions at home: Eating and drinking  Eat a diet that includes fresh fruits and vegetables, whole grains, lean protein, and low-fat dairy products.  Take vitamin and mineral supplements as  recommended by your health care provider.  Do not drink alcohol if: ? Your health care provider tells you not to drink. ? You are pregnant, may be pregnant, or are planning to become pregnant.  If you drink alcohol: ? Limit how much you have to 0-1 drink a day. ? Be aware of how much alcohol is in your drink. In the [place] one drink equals one 12 oz bottle of beer (355 mL), one 5 oz glass of wine (148 mL), or one 1 oz glass of hard liquor (44 mL).   Lifestyle  Take daily care of your teeth and gums. Brush your teeth every morning and night with fluoride toothpaste. Floss one time each day.  Stay active. Exercise for at least 30 minutes 5 or more days each week.  Do not use any products that contain nicotine or tobacco, such as cigarettes, e-cigarettes, and chewing tobacco. If you need help quitting, ask your health care provider.  Do not use drugs.  If you are sexually active, practice safe sex. Use a condom or other form of protection to prevent STIs (sexually transmitted infections).  If you do not wish to become pregnant, use a form of birth control. If you plan to become pregnant, see your health care provider for a prepregnancy visit.  If told by your health care provider, take low-dose aspirin daily starting at age 83.  Find healthy ways to cope with stress, such as: ? Meditation, yoga, or listening to music. ? Journaling. ? Talking to a trusted person. ? Spending time with friends and family. Safety  Always wear your seat belt while driving or riding in a vehicle.  Do not drive: ? If you have been drinking alcohol. Do not ride with someone who has been drinking. ? When you are tired or distracted. ? While texting.  Wear a helmet and other protective equipment during sports activities.  If you have firearms in your house, make sure you follow all gun safety procedures. What's next?  Visit your health care provider once a year for an annual wellness visit.  Ask your  health care provider how often you should have your eyes and teeth checked.  Stay up to date on all vaccines. This information is not intended to replace advice given to you by your health care provider. Make sure you discuss any questions you have with your health care provider. Document Revised: 02/03/2020 Document Reviewed: 01/10/2018 Elsevier Patient Education  2021 [employer].

## 2020-08-03 ENCOUNTER — Other Ambulatory Visit: Payer: Self-pay | Admitting: Family Medicine

## 2020-08-03 DIAGNOSIS — E2839 Other primary ovarian failure: Secondary | ICD-10-CM

## 2020-08-03 LAB — CBC WITH DIFFERENTIAL/PLATELET
Basophils Absolute: 0 10*3/uL (ref 0.0–0.2)
Basos: 1 %
EOS (ABSOLUTE): 0.1 10*3/uL (ref 0.0–0.4)
Eos: 2 %
Hematocrit: 40.6 % (ref 34.0–46.6)
Hemoglobin: 14.1 g/dL (ref 11.1–15.9)
Immature Grans (Abs): 0 10*3/uL (ref 0.0–0.1)
Immature Granulocytes: 0 %
Lymphocytes Absolute: 1.8 10*3/uL (ref 0.7–3.1)
Lymphs: 28 %
MCH: 29.2 pg (ref 26.6–33.0)
MCHC: 34.7 g/dL (ref 31.5–35.7)
MCV: 84 fL (ref 79–97)
Monocytes Absolute: 0.4 10*3/uL (ref 0.1–0.9)
Monocytes: 7 %
Neutrophils Absolute: 3.9 10*3/uL (ref 1.4–7.0)
Neutrophils: 62 %
Platelets: 282 10*3/uL (ref 150–450)
RBC: 4.83 x10E6/uL (ref 3.77–5.28)
RDW: 13.4 % (ref 11.7–15.4)
WBC: 6.3 10*3/uL (ref 3.4–10.8)

## 2020-08-03 LAB — COMPREHENSIVE METABOLIC PANEL
ALT: 31 IU/L (ref 0–32)
AST: 27 IU/L (ref 0–40)
Albumin/Globulin Ratio: 2.2 (ref 1.2–2.2)
Albumin: 4.8 g/dL (ref 3.8–4.9)
Alkaline Phosphatase: 88 IU/L (ref 44–121)
BUN/Creatinine Ratio: 21 (ref 9–23)
BUN: 11 mg/dL (ref 6–24)
Bilirubin Total: 0.3 mg/dL (ref 0.0–1.2)
CO2: 21 mmol/L (ref 20–29)
Calcium: 9.6 mg/dL (ref 8.7–10.2)
Chloride: 100 mmol/L (ref 96–106)
Creatinine, Ser: 0.52 mg/dL — ABNORMAL LOW (ref 0.57–1.00)
Globulin, Total: 2.2 g/dL (ref 1.5–4.5)
Glucose: 87 mg/dL (ref 65–99)
Potassium: 4 mmol/L (ref 3.5–5.2)
Sodium: 138 mmol/L (ref 134–144)
Total Protein: 7 g/dL (ref 6.0–8.5)
eGFR: 108 mL/min/{1.73_m2} (ref >59)

## 2020-08-03 LAB — T4, FREE: Free T4: 1.15 ng/dL (ref 0.82–1.77)

## 2020-08-03 LAB — LIPID PANEL
Chol/HDL Ratio: 3.3 ratio (ref 0.0–4.4)
Cholesterol, Total: 160 mg/dL (ref 100–199)
HDL: 49 mg/dL (ref >39)
LDL Chol Calc (NIH): 84 mg/dL (ref 0–99)
Triglycerides: 156 mg/dL — ABNORMAL HIGH (ref 0–149)
VLDL Cholesterol Cal: 27 mg/dL (ref 5–40)

## 2020-08-03 LAB — MICROALBUMIN / CREATININE URINE RATIO
Creatinine, Urine: 6.3 mg/dL
Microalbumin, Urine: <3 ug/mL

## 2020-08-03 LAB — HEMOGLOBIN A1C
Est. average glucose Bld gHb Est-mCnc: 134 mg/dL
Hgb A1c MFr Bld: 6.3 % — ABNORMAL HIGH (ref 4.8–5.6)

## 2020-08-03 LAB — TSH: TSH: 2.17 u[IU]/mL (ref 0.450–4.500)

## 2020-08-03 NOTE — Progress Notes (Signed)
Her diabetes is still controlled with a hemoglobin A1c of 6.3%.  Continue with the Metformin and eating a low sugar, low-carb diet.  Her cholesterol has improved on the medication.  Continue taking the statin.  Please make sure she has refills for 6 months until I see her again.

## 2020-08-31 ENCOUNTER — Other Ambulatory Visit: Payer: Self-pay

## 2020-08-31 ENCOUNTER — Ambulatory Visit
Admission: RE | Admit: 2020-08-31 | Discharge: 2020-08-31 | Disposition: A | Discharge status: Home / Self Care | Payer: 59 | Source: Ambulatory Visit | Attending: Family Medicine | Admitting: Family Medicine

## 2020-08-31 DIAGNOSIS — E2839 Other primary ovarian failure: Secondary | ICD-10-CM

## 2020-09-01 ENCOUNTER — Encounter: Payer: Self-pay | Admitting: Family Medicine

## 2020-09-01 DIAGNOSIS — M8580 Other specified disorders of bone density and structure, unspecified site: Secondary | ICD-10-CM

## 2020-09-01 HISTORY — DX: Other specified disorders of bone density and structure, unspecified site: M85.80

## 2020-09-01 NOTE — Progress Notes (Signed)
Please let her know that her bone density test shows that she has osteopenia which means low bone mass but does not have osteoporosis.  It is recommended that she get plenty of calcium in her diet and take a daily vitamin D3 supplement.  It is also recommended that she get plenty of weightbearing exercises including walking.  We will repeat the bone density test in 2 years.

## 2020-09-09 ENCOUNTER — Encounter: Payer: Self-pay | Admitting: Internal Medicine

## 2020-09-10 ENCOUNTER — Encounter: Payer: Self-pay | Admitting: Family Medicine

## 2020-10-04 ENCOUNTER — Other Ambulatory Visit: Payer: Self-pay

## 2020-10-04 ENCOUNTER — Encounter: Payer: Self-pay | Admitting: Family Medicine

## 2020-10-04 ENCOUNTER — Ambulatory Visit (INDEPENDENT_AMBULATORY_CARE_PROVIDER_SITE_OTHER): Payer: 59 | Admitting: Family Medicine

## 2020-10-04 VITALS — BP 120/76 | HR 93 | Temp 97.9°F | Wt 173.6 lb

## 2020-10-04 DIAGNOSIS — R1011 Right upper quadrant pain: Secondary | ICD-10-CM

## 2020-10-04 DIAGNOSIS — R932 Abnormal findings on diagnostic imaging of liver and biliary tract: Secondary | ICD-10-CM | POA: Diagnosis not present

## 2020-10-04 NOTE — Patient Instructions (Signed)
I want you to avoid greasy foods.  You can take 2 Tylenol 4 times per day for the pain and you can take 2 Aleve twice per day if you need to for the pain

## 2020-10-04 NOTE — Progress Notes (Signed)
   Subjective:    Patient ID: Traci Young, female    DOB: [birth year], 60 y.o.   MRN: [id number]  HPI She states that on Saturday she had some periumbilical cramping type pain.  She cannot relate this to any particular foods.  There is a slight language barrier due to her poor [place] skills.  She cannot relate this to anything in particular specifically greasy foods.   Review of Systems     Objective:   Physical Exam Alert and complaining of abdominal pain.  Cardiac exam shows regular rhythm without murmurs or gallops.  Lungs are clear to auscultation.  Abdominal exam shows decreased bowel sounds with positive Murphy's sign and Murphy's punch.       Assessment & Plan:  Right upper quadrant abdominal pain The ultrasound was ordered stat.  She is to avoid all greasy foods, use Tylenol and Aleve for pain relief.  She expressed understanding of this.

## 2020-10-06 ENCOUNTER — Ambulatory Visit
Admission: RE | Admit: 2020-10-06 | Discharge: 2020-10-06 | Disposition: A | Discharge status: Home / Self Care | Payer: 59 | Source: Ambulatory Visit | Attending: Family Medicine | Admitting: Family Medicine

## 2020-10-06 NOTE — Addendum Note (Signed)
Addended by: Ronnald Nian on: 10/06/2020 09:41 AM   Modules accepted: Orders

## 2020-10-29 ENCOUNTER — Encounter: Payer: Self-pay | Admitting: Family Medicine

## 2021-01-25 ENCOUNTER — Encounter: Payer: Self-pay | Admitting: Family Medicine

## 2021-01-25 ENCOUNTER — Other Ambulatory Visit: Payer: Self-pay

## 2021-01-25 ENCOUNTER — Ambulatory Visit (INDEPENDENT_AMBULATORY_CARE_PROVIDER_SITE_OTHER): Payer: 59 | Admitting: Family Medicine

## 2021-01-25 VITALS — BP 116/82 | HR 83 | Temp 98.4°F | Ht 62.25 in | Wt 176.0 lb

## 2021-01-25 DIAGNOSIS — M25511 Pain in right shoulder: Secondary | ICD-10-CM | POA: Diagnosis not present

## 2021-01-25 DIAGNOSIS — E119 Type 2 diabetes mellitus without complications: Secondary | ICD-10-CM

## 2021-01-25 DIAGNOSIS — G8929 Other chronic pain: Secondary | ICD-10-CM

## 2021-01-25 DIAGNOSIS — E669 Obesity, unspecified: Secondary | ICD-10-CM

## 2021-01-25 DIAGNOSIS — Z23 Encounter for immunization: Secondary | ICD-10-CM | POA: Diagnosis not present

## 2021-01-25 DIAGNOSIS — E1169 Type 2 diabetes mellitus with other specified complication: Secondary | ICD-10-CM

## 2021-01-25 DIAGNOSIS — K130 Diseases of lips: Secondary | ICD-10-CM

## 2021-01-25 DIAGNOSIS — Z9889 Other specified postprocedural states: Secondary | ICD-10-CM

## 2021-01-25 DIAGNOSIS — E785 Hyperlipidemia, unspecified: Secondary | ICD-10-CM

## 2021-01-25 DIAGNOSIS — M25619 Stiffness of unspecified shoulder, not elsewhere classified: Secondary | ICD-10-CM | POA: Insufficient documentation

## 2021-01-25 MED ORDER — DICLOFENAC SODIUM 1 % EX GEL: 2.0000 g | Freq: Four times a day (QID) | CUTANEOUS | 0 refills | Status: DC

## 2021-01-25 NOTE — Patient Instructions (Signed)
You will hear from [facility] for your shoulder pain.   I recommend trying a topical pain medication such as the Voltaren gel I prescribed.   Continue on your current medications.   See your dentist for the lip problem. You may need a biopsy.

## 2021-01-25 NOTE — Progress Notes (Signed)
Subjective:    Patient ID: Traci Young, female    DOB: [birth year], 60 y.o.   MRN: [id number]  HPI Chief Complaint  Patient presents with   Medication Check   She is here for a medication management visit including diabetes follow up.   Last Hgb A1c 6.3% in March 2022.  Blood sugars at home have been 130-140  Reports taking metformin daily. No side effects.   She is also taking her statin daily.   She is having gallbladder surgery in October.   Complains of right shoulder pain since having shoulder surgery 2010-2011. Pain is intermittent  and located from her shoulder to her wrist. She  is having issues sleeping now due to pain. Using extra pillows to get comfortable.  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 Would like to have her shoulder re-evaluated to see if something could be done to help.  No numbness, tingling or weakness.   Takes Advil or Tylenol for severe pain.   She also complains of a lip lesion with pain to her lower lip. She has an upcoming appt with her dentist to discuss this.   Denies fever, chills, dizziness, chest pain, palpitations, shortness of breath, N/V/D, urinary symptoms, LE edema.   Reviewed allergies, medications, past medical, surgical, family, and social history.     Review of Systems Pertinent positives and negatives in the history of present illness.     Objective:   Physical Exam Constitutional:      General: She is not in acute distress.    Appearance: Normal appearance. She is not ill-appearing.  HENT:     Head:     Comments: Thickened and hypopigmented lesion to lower lip at midline Cardiovascular:     Rate and Rhythm: Normal rate and regular rhythm.     Pulses: Normal pulses.  Pulmonary:     Effort: Pulmonary effort is normal.     Breath sounds: Normal breath sounds.  Musculoskeletal:     Right shoulder: Tenderness present. Decreased range of motion. Decreased strength. Normal pulse.     Left shoulder:  Normal.     Cervical back: Normal range of motion and neck supple.     Comments: Right shoulder with tenderness to palpation at the anterior inferior AC joint and bicep insertion. Limited ROM. Right arm is neurovascularly intact.   Skin:    General: Skin is warm and dry.  Neurological:     General: No focal deficit present.     Mental Status: She is alert and oriented to person, place, and time.     Sensory: No sensory deficit.   BP 116/82 (BP Location: Left Arm, Patient Position: Sitting, Cuff Size: Normal)   Pulse 83   Temp 98.4 F (36.9 C) (Oral)   Ht 5' 2.25" (1.581 m)   Wt 176 lb (79.8 kg)   LMP 06/30/2015   SpO2 98%   BMI 31.93 kg/m       Assessment & Plan:  Controlled type 2 diabetes mellitus without complication, without long-term current use of insulin (HCC) - Plan: CBC with Differential/Platelet, Comprehensive metabolic panel, TSH, Hemoglobin A1c  Hyperlipidemia associated with type 2 diabetes mellitus (HCC)  Chronic right shoulder pain - Plan: diclofenac Sodium (VOLTAREN) 1 % GEL, Ambulatory referral to Orthopedic Surgery  History of shoulder surgery - Plan: Ambulatory referral to Orthopedic Surgery  Limited range of motion (ROM) of shoulder - Plan: Ambulatory referral to Orthopedic Surgery  Lip lesion  Obesity (BMI 30.0-34.9)  Information  obtained and advise provided via medical interpreter on a stick.  Diabetes controlled. Continue metformin and follow up pending A1c result.  HL- continue statin therapy Shoulder pain with history of surgery and limited ROM- refer to ortho, voltaren gel prescribed.  Follow up with dentist for lip lesion.  Work on weight loss, cut back on carbohydrates and increase walking or physical activity.

## 2021-01-26 LAB — COMPREHENSIVE METABOLIC PANEL
ALT: 25 IU/L (ref 0–32)
AST: 21 IU/L (ref 0–40)
Albumin/Globulin Ratio: 1.9 (ref 1.2–2.2)
Albumin: 4.8 g/dL (ref 3.8–4.9)
Alkaline Phosphatase: 77 IU/L (ref 44–121)
BUN/Creatinine Ratio: 25 — ABNORMAL HIGH (ref 9–23)
BUN: 13 mg/dL (ref 6–24)
Bilirubin Total: 0.2 mg/dL (ref 0.0–1.2)
CO2: 23 mmol/L (ref 20–29)
Calcium: 10.1 mg/dL (ref 8.7–10.2)
Chloride: 98 mmol/L (ref 96–106)
Creatinine, Ser: 0.53 mg/dL — ABNORMAL LOW (ref 0.57–1.00)
Globulin, Total: 2.5 g/dL (ref 1.5–4.5)
Glucose: 85 mg/dL (ref 65–99)
Potassium: 4.2 mmol/L (ref 3.5–5.2)
Sodium: 138 mmol/L (ref 134–144)
Total Protein: 7.3 g/dL (ref 6.0–8.5)
eGFR: 106 mL/min/{1.73_m2} (ref >59)

## 2021-01-26 LAB — CBC WITH DIFFERENTIAL/PLATELET
Basophils Absolute: 0.1 10*3/uL (ref 0.0–0.2)
Basos: 1 %
EOS (ABSOLUTE): 0.1 10*3/uL (ref 0.0–0.4)
Eos: 2 %
Hematocrit: 44.1 % (ref 34.0–46.6)
Hemoglobin: 14.8 g/dL (ref 11.1–15.9)
Immature Grans (Abs): 0 10*3/uL (ref 0.0–0.1)
Immature Granulocytes: 0 %
Lymphocytes Absolute: 1.8 10*3/uL (ref 0.7–3.1)
Lymphs: 28 %
MCH: 28.4 pg (ref 26.6–33.0)
MCHC: 33.6 g/dL (ref 31.5–35.7)
MCV: 85 fL (ref 79–97)
Monocytes Absolute: 0.4 10*3/uL (ref 0.1–0.9)
Monocytes: 6 %
Neutrophils Absolute: 4.2 10*3/uL (ref 1.4–7.0)
Neutrophils: 63 %
Platelets: 292 10*3/uL (ref 150–450)
RBC: 5.21 x10E6/uL (ref 3.77–5.28)
RDW: 13.5 % (ref 11.7–15.4)
WBC: 6.6 10*3/uL (ref 3.4–10.8)

## 2021-01-26 LAB — TSH: TSH: 1.81 u[IU]/mL (ref 0.450–4.500)

## 2021-01-26 LAB — HEMOGLOBIN A1C
Est. average glucose Bld gHb Est-mCnc: 137 mg/dL
Hgb A1c MFr Bld: 6.4 % — ABNORMAL HIGH (ref 4.8–5.6)

## 2021-01-26 NOTE — Progress Notes (Signed)
Her labs are fine and her diabetes is stable with her Hgb A1c 6.4% which is basically the same as the past year. Continue doing what she is currently doing.

## 2021-02-01 ENCOUNTER — Other Ambulatory Visit: Payer: Self-pay

## 2021-02-01 ENCOUNTER — Encounter: Payer: Self-pay | Admitting: Orthopaedic Surgery

## 2021-02-01 ENCOUNTER — Ambulatory Visit (INDEPENDENT_AMBULATORY_CARE_PROVIDER_SITE_OTHER): Payer: 59

## 2021-02-01 ENCOUNTER — Ambulatory Visit (INDEPENDENT_AMBULATORY_CARE_PROVIDER_SITE_OTHER): Payer: 59 | Admitting: Orthopaedic Surgery

## 2021-02-01 DIAGNOSIS — M25511 Pain in right shoulder: Secondary | ICD-10-CM

## 2021-02-01 DIAGNOSIS — G8929 Other chronic pain: Secondary | ICD-10-CM

## 2021-02-01 NOTE — Progress Notes (Signed)
Office Visit Note   Patient: Traci Young           Date of Birth: [birth year]           MRN: [id number] Visit Date: 02/01/2021              Requested by: Avanell Shackleton, NP-C [address] [place],  [place] [address] PCP: Avanell Shackleton, NP-C   Assessment & Plan: Visit Diagnoses:  1. Chronic right shoulder pain     Plan: Based on findings and the fact that she has had prior surgery I am concerned that she has rotator cuff arthropathy due to patient report that the rotator cuff could not be repaired.  We will need to get MRI to assess for structural abnormalities so that we can have a discussion about her options.  Interpreter present today.  Follow-Up Instructions: Return for after MRI.   Orders:  Orders Placed This Encounter  Procedures   XR Shoulder Right   No orders of the defined types were placed in this encounter.     Procedures: No procedures performed   Clinical Data: No additional findings.   Subjective: Chief Complaint  Patient presents with   Right Shoulder - New Patient (Initial Visit)    Traci Young is a very pleasant 60 year old female here for evaluation of chronic right shoulder pain 5 out of 10 that is worse with lifting and reaching.  There is increased pain with with lowering the arm as well.  She is unable to lift anything heavier than a couple pounds.  She is status post right shoulder surgery in 2010 and she underwent extensive physical therapy afterwards which did not help.  She has live with the pain since then but recently has gotten worse.  Denies any numbness and tingling.  Occasionally she has had to call out of work because of the pain.  She is right-hand dominant.   Review of Systems  Constitutional: Negative.   HENT: Negative.    Eyes: Negative.   Respiratory: Negative.    Cardiovascular: Negative.   Endocrine: Negative.   Musculoskeletal: Negative.   Neurological: Negative.   Hematological: Negative.    Psychiatric/Behavioral: Negative.    All other systems reviewed and are negative.   Objective: Vital Signs: LMP 06/30/2015   Physical Exam Vitals and nursing note reviewed.  Constitutional:      Appearance: She is well-developed.  Pulmonary:     Effort: Pulmonary effort is normal.  Skin:    General: Skin is warm.     Capillary Refill: Capillary refill takes less than 2 seconds.  Neurological:     Mental Status: She is alert and oriented to person, place, and time.  Psychiatric:        Behavior: Behavior normal.        Thought Content: Thought content normal.        Judgment: Judgment normal.    Ortho Exam  Right shoulder shows full passive range of motion with mild pain.  Active range of motion is normal but with moderate pain towards the extremes and with exertion against resistance to empty can and infraspinatus.  Negative belly press and bearhug.  Specialty Comments:  No specialty comments available.  Imaging: XR Shoulder Right  Result Date: 02/01/2021 Irregularity around the greater tuberosity consistent with prior rotator cuff surgery.  Decreased acromiohumeral distance.  Slight elevation of humeral head.    PMFS History: Patient Active Problem List   Diagnosis Date Noted   Chronic  right shoulder pain 01/25/2021   History of shoulder surgery 01/25/2021   Limited range of motion (ROM) of shoulder 01/25/2021   Osteopenia determined by x-ray 09/01/2020   Hyperlipidemia associated with type 2 diabetes mellitus (HCC) 04/05/2020   Changes in vision 06/03/2019   Diabetes mellitus, new onset (HCC) 05/19/2019   Elevated ALT measurement 05/19/2019   Unexplained weight loss 05/19/2019   Abnormal appearance of cervix 05/19/2019   Language barrier 05/19/2019   HYPERCHOLESTEROLEMIA 03/15/2009   OVERWEIGHT 03/15/2009   BRADYCARDIA 03/15/2009   CHEST PAIN 03/15/2009   Past Medical History:  Diagnosis Date   Diabetes mellitus, new onset (HCC) 05/19/2019   Hgb A1c >14%    Elevated lipids    no meds, diet  controlled   Obesity    Osteopenia determined by x-ray 09/01/2020   SVD (spontaneous vaginal delivery)    x 4    Family History  Problem Relation Age of Onset   Heart disease Mother 19   Heart disease Father 65   Diabetes Father    Colon cancer Neg Hx    Rectal cancer Neg Hx    Stomach cancer Neg Hx     Past Surgical History:  Procedure Laterality Date   ENDOVENOUS ABLATION SAPHENOUS VEIN W/ LASER Right 04/24/2018   endovenous laser ablation right greater saphenous vein and stab phlebectomy > 20 incisions right leg by Fabienne Bruns MD    SHOULDER SURGERY Right    TUBAL LIGATION     Social History   Occupational History   Not on file  Tobacco Use   Smoking status: Former    Packs/day: 0.25    Years: 6.00    Pack years: 1.50    Types: Cigarettes   Smokeless tobacco: Never   Tobacco comments:    Patient quit smoking 23 years ago  Vaping Use   Vaping Use: Never used  Substance and Sexual Activity   Alcohol use: No   Drug use: No   Sexual activity: Yes    Birth control/protection: Surgical, Post-menopausal    Comment: Tubal Ligation

## 2021-02-17 ENCOUNTER — Ambulatory Visit
Admission: RE | Admit: 2021-02-17 | Discharge: 2021-02-17 | Disposition: A | Discharge status: Home / Self Care | Payer: 59 | Source: Ambulatory Visit | Attending: Physician Assistant | Admitting: Physician Assistant

## 2021-02-17 ENCOUNTER — Other Ambulatory Visit: Payer: Self-pay

## 2021-02-17 DIAGNOSIS — G8929 Other chronic pain: Secondary | ICD-10-CM

## 2021-02-17 DIAGNOSIS — M25511 Pain in right shoulder: Secondary | ICD-10-CM

## 2021-02-21 NOTE — Progress Notes (Signed)
F/u to discuss

## 2021-02-22 ENCOUNTER — Ambulatory Visit (INDEPENDENT_AMBULATORY_CARE_PROVIDER_SITE_OTHER): Payer: 59 | Admitting: Orthopaedic Surgery

## 2021-02-22 ENCOUNTER — Other Ambulatory Visit: Payer: Self-pay

## 2021-02-22 ENCOUNTER — Encounter: Payer: Self-pay | Admitting: Orthopaedic Surgery

## 2021-02-22 DIAGNOSIS — G8929 Other chronic pain: Secondary | ICD-10-CM | POA: Diagnosis not present

## 2021-02-22 DIAGNOSIS — M25511 Pain in right shoulder: Secondary | ICD-10-CM

## 2021-02-22 NOTE — Progress Notes (Signed)
Office Visit Note   Patient: Traci Young           Date of Birth: 08-29-1960           MRN: [id number] Visit Date: 02/22/2021              Requested by: Avanell Shackleton, PA-C [address] [place],  [place] [address] PCP: Avanell Shackleton, [place]   Assessment & Plan: Visit Diagnoses:  1. 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  There is significant atrophy of the supraspinatus muscle belly greater than 75%.  Based on these findings I have recommended conservative management with outpatient PT and glenohumeral injection.  The supraspinatus does not appear to be repairable.  1 surgical option that we could consider if conservative management fails that is for shoulder arthroscopy with extensive debridement and biceps tenotomy.  Together we agreed to try the PT and the injection first.  Follow-Up Instructions: No follow-ups on file.   Orders:  Orders Placed This Encounter  Procedures   Ambulatory referral to Physical Therapy   No orders of the defined types were placed in this encounter.     Procedures: No procedures performed   Clinical Data: No additional findings.   Subjective: Chief Complaint  Patient presents with   Right Shoulder - Pain    HPI  Kamari returns today with interpreter to discuss right shoulder MRI.  She continues to have pain and weakness with elevation of the arm.  Review of Systems   Objective: Vital Signs: LMP 06/30/2015   Physical Exam  Ortho Exam  Right shoulder shows normal passive range of motion.  Active range of motion is mildly limited secondary to pain.  Positive drop arm.  Negative Speed test.  Specialty Comments:  No specialty comments available.  Imaging: No results found.   PMFS History: Patient Active Problem List   Diagnosis Date Noted   Chronic right shoulder pain 01/25/2021   History of shoulder surgery 01/25/2021    Limited range of motion (ROM) of shoulder 01/25/2021   Osteopenia determined by x-ray 09/01/2020   Hyperlipidemia associated with type 2 diabetes mellitus (HCC) 04/05/2020   Changes in vision 06/03/2019   Diabetes mellitus, new onset (HCC) 05/19/2019   Elevated ALT measurement 05/19/2019   Unexplained weight loss 05/19/2019   Abnormal appearance of cervix 05/19/2019   Language barrier 05/19/2019   HYPERCHOLESTEROLEMIA 03/15/2009   OVERWEIGHT 03/15/2009   BRADYCARDIA 03/15/2009   CHEST PAIN 03/15/2009   Past Medical History:  Diagnosis Date   Diabetes mellitus, new onset (HCC) 05/19/2019   Hgb A1c >14%   Elevated lipids    no meds, diet  controlled   Obesity    Osteopenia determined by x-ray 09/01/2020   SVD (spontaneous vaginal delivery)    x 4    Family History  Problem Relation Age of Onset   Heart disease Mother 2   Heart disease Father 50   Diabetes Father    Colon cancer Neg Hx    Rectal cancer Neg Hx    Stomach cancer Neg Hx     Past Surgical History:  Procedure Laterality Date   ENDOVENOUS ABLATION SAPHENOUS VEIN W/ LASER Right 04/24/2018   endovenous laser ablation right greater saphenous vein and stab phlebectomy > 20 incisions right leg by Fabienne Bruns MD    SHOULDER SURGERY Right    TUBAL LIGATION  Social History   Occupational History   Not on file  Tobacco Use   Smoking status: Former    Packs/day: 0.25    Years: 6.00    Pack years: 1.50    Types: Cigarettes   Smokeless tobacco: Never   Tobacco comments:    Patient quit smoking 23 years ago  Vaping Use   Vaping Use: Never used  Substance and Sexual Activity   Alcohol use: No   Drug use: No   Sexual activity: Yes    Birth control/protection: Surgical, Post-menopausal    Comment: Tubal Ligation

## 2021-02-23 ENCOUNTER — Other Ambulatory Visit: Payer: Self-pay

## 2021-02-23 DIAGNOSIS — G8929 Other chronic pain: Secondary | ICD-10-CM

## 2021-03-03 ENCOUNTER — Encounter (HOSPITAL_COMMUNITY): Payer: Self-pay | Admitting: General Surgery

## 2021-03-03 ENCOUNTER — Other Ambulatory Visit: Payer: Self-pay

## 2021-03-03 NOTE — Progress Notes (Addendum)
Spoke with pt for pre-op call via [address] [place], [place] [phone]. Pt denies cardiac history or HTN. Pt is a type 2 diabetic. Last A1C was 6.2 on 01/25/21. Pt states her fasting blood sugar is usually between 120-135. Instructed pt not to take Metformin in the AM. Instructed her to check her blood sugar in the AM when she wakes up. If blood sugar is 70 or below, treat with 1/2 cup of clear juice (apple or cranberry) and recheck blood sugar 15 minutes after drinking juice. If blood sugar continues to be 70 or below, notify your nurse on your arrival to Short Stay.  Pt's surgery is scheduled as ambulatory so no Covid test is required prior to surgery.

## 2021-03-03 NOTE — Anesthesia Preprocedure Evaluation (Addendum)
Anesthesia Evaluation  Patient identified by MRN, date of birth, ID band Patient awake    Reviewed: Allergy & Precautions, NPO status , Patient's Chart, lab work & pertinent test results  Airway Mallampati: I       Dental no notable dental hx.    Pulmonary former smoker,    Pulmonary exam normal        Cardiovascular negative cardio ROS Normal cardiovascular exam     Neuro/Psych negative neurological ROS  negative psych ROS   GI/Hepatic negative GI ROS, Neg liver ROS,   Endo/Other  diabetes, Type 2, Oral Hypoglycemic Agents  Renal/GU   negative genitourinary   Musculoskeletal negative musculoskeletal ROS (+)   Abdominal Normal abdominal exam  (+)   Peds  Hematology negative hematology ROS (+)   Anesthesia Other Findings   Reproductive/Obstetrics                            Anesthesia Physical Anesthesia Plan  ASA: 2  Anesthesia Plan: General   Post-op Pain Management:    Induction: Intravenous  PONV Risk Score and Plan: 4 or greater and Ondansetron and Midazolam  Airway Management Planned: Oral ETT  Additional Equipment: None  Intra-op Plan:   Post-operative Plan: Extubation in OR  Informed Consent: I have reviewed the patients History and Physical, chart, labs and discussed the procedure including the risks, benefits and alternatives for the proposed anesthesia with the patient or authorized representative who has indicated his/her understanding and acceptance.     Dental advisory given  Plan Discussed with: CRNA  Anesthesia Plan Comments:        Anesthesia Quick Evaluation

## 2021-03-03 NOTE — H&P (Signed)
Chief Complaint: No chief complaint on file.       History of Present Illness: Patient is a 60 year old female who follows back up today secondary to gallstones. Patient states that she is continue with right upper quadrant pain as well as pain that radiates to her right back.  She states that she has been trying to avoid any high fatty foods.  She states that the pain usually is fairly consistent.  She denies any nausea or vomiting.   Patient previously had a ultrasound revealed multiple tiny stones.         Review of Systems: A complete review of systems was obtained from the patient.  I have reviewed this information and discussed as appropriate with the patient.  See HPI as well for other ROS.   Review of Systems  Constitutional: Negative for fever.  HENT: Negative for congestion.   Eyes: Negative for blurred vision.  Respiratory: Negative for cough, shortness of breath and wheezing.   Cardiovascular: Negative for chest pain and palpitations.  Gastrointestinal: Negative for heartburn.  Genitourinary: Negative for dysuria.  Musculoskeletal: Negative for myalgias.  Skin: Negative for rash.  Neurological: Negative for dizziness and headaches.  Psychiatric/Behavioral: Negative for depression and suicidal ideas.  All other systems reviewed and are negative.       Medical History: Past Medical History Past Medical History: Diagnosis Date  Diabetes mellitus without complication (CMS-HCC)        There is no problem list on file for this patient.     Past Surgical History Past Surgical History: Procedure Laterality Date  arm surgery  N/A        Allergies No Known Allergies    Current Outpatient Medications on File Prior to Visit Medication Sig Dispense Refill  atorvastatin (LIPITOR) 20 MG tablet Take 20 mg by mouth once daily      blood glucose diagnostic (PRECISION XTRA TEST) test strip Test twice a day. Pt has one touch verio meter      lancets Use       metFORMIN (GLUCOPHAGE) 1000 MG tablet Take by mouth       No current facility-administered medications on file prior to visit.     Family History Family History Problem Relation Age of Onset  Diabetes Mother    Diabetes Father        Social History   Tobacco Use Smoking Status Never Smoker Smokeless Tobacco Never Used     Social History Social History    Socioeconomic History  Marital status: Unknown Tobacco Use  Smoking status: Never Smoker  Smokeless tobacco: Never Used Advertising account planner Use: Never used Substance and Sexual Activity  Alcohol use: Never  Drug use: Never  Sexual activity: Defer      Objective:     There were no vitals filed for this visit.  There is no height or weight on file to calculate BMI.   Physical Exam Constitutional:      Appearance: Normal appearance.  HENT:     Head: Normocephalic and atraumatic.     Mouth/Throat:     Mouth: Mucous membranes are moist.     Pharynx: Oropharynx is clear.  Eyes:     General: No scleral icterus.    Pupils: Pupils are equal, round, and reactive to light.  Cardiovascular:     Rate and Rhythm: Normal rate and regular rhythm.     Pulses: Normal pulses.     Heart sounds: No murmur heard.   No friction rub.  No gallop.  Pulmonary:     Effort: Pulmonary effort is normal. No respiratory distress.     Breath sounds: Normal breath sounds. No stridor.  Abdominal:     General: Abdomen is flat.  Musculoskeletal:        General: No swelling.  Skin:    General: Skin is warm.  Neurological:     General: No focal deficit present.     Mental Status: She is alert and oriented to person, place, and time. Mental status is at baseline.  Psychiatric:        Mood and Affect: Mood normal.        Thought Content: Thought content normal.        Judgment: Judgment normal.            Assessment and Plan: Diagnoses and all orders for this visit:   Calculus of gallbladder without cholecystitis without  obstruction     Traci Young is a 60 y.o. female    1.  We will proceed to the OR for a lap cholecystectomy. 2. All risks and benefits were discussed with the patient to generally include: infection, bleeding, possible need for post op ERCP, damage to the bile ducts, and bile leak. Alternatives were offered and described.  All questions were answered and the patient voiced understanding of the procedure and wishes to proceed at this point with a laparoscopic cholecystectomy           No follow-ups on file.   Axel Filler, MD, [facility], [facility]

## 2021-03-04 ENCOUNTER — Encounter (HOSPITAL_COMMUNITY)
Admission: RE | Disposition: A | Discharge status: Home / Self Care | Payer: Self-pay | Source: Ambulatory Visit | Attending: General Surgery

## 2021-03-04 ENCOUNTER — Encounter (HOSPITAL_COMMUNITY): Payer: Self-pay | Admitting: General Surgery

## 2021-03-04 ENCOUNTER — Ambulatory Visit (HOSPITAL_COMMUNITY): Payer: 59 | Admitting: Certified Registered"

## 2021-03-04 ENCOUNTER — Other Ambulatory Visit: Payer: Self-pay

## 2021-03-04 ENCOUNTER — Ambulatory Visit (HOSPITAL_COMMUNITY)
Admission: RE | Admit: 2021-03-04 | Discharge: 2021-03-04 | Disposition: A | Discharge status: Home / Self Care | Payer: 59 | Source: Ambulatory Visit | Attending: General Surgery | Admitting: General Surgery

## 2021-03-04 DIAGNOSIS — Z87891 Personal history of nicotine dependence: Secondary | ICD-10-CM | POA: Insufficient documentation

## 2021-03-04 DIAGNOSIS — K8010 Calculus of gallbladder with chronic cholecystitis without obstruction: Secondary | ICD-10-CM | POA: Insufficient documentation

## 2021-03-04 HISTORY — PX: CHOLECYSTECTOMY: SHX55

## 2021-03-04 LAB — POCT I-STAT, CHEM 8
BUN: 16 mg/dL (ref 6–20)
Calcium, Ion: 1.24 mmol/L (ref 1.15–1.40)
Chloride: 103 mmol/L (ref 98–111)
Creatinine, Ser: 0.5 mg/dL (ref 0.44–1.00)
Glucose, Bld: 144 mg/dL — ABNORMAL HIGH (ref 70–99)
HCT: 44 % (ref 36.0–46.0)
Hemoglobin: 15 g/dL (ref 12.0–15.0)
Potassium: 4.1 mmol/L (ref 3.5–5.1)
Sodium: 140 mmol/L (ref 135–145)
TCO2: 25 mmol/L (ref 22–32)

## 2021-03-04 LAB — BASIC METABOLIC PANEL
Anion gap: 10 (ref 5–15)
BUN: 15 mg/dL (ref 6–20)
CO2: 23 mmol/L (ref 22–32)
Calcium: 9.4 mg/dL (ref 8.9–10.3)
Chloride: 104 mmol/L (ref 98–111)
Creatinine, Ser: 0.49 mg/dL (ref 0.44–1.00)
GFR, Estimated: >60 mL/min (ref >60)
Glucose, Bld: 141 mg/dL — ABNORMAL HIGH (ref 70–99)
Potassium: 4 mmol/L (ref 3.5–5.1)
Sodium: 137 mmol/L (ref 135–145)

## 2021-03-04 LAB — CBC
HCT: 43.8 % (ref 36.0–46.0)
Hemoglobin: 14.9 g/dL (ref 12.0–15.0)
MCH: 29.3 pg (ref 26.0–34.0)
MCHC: 34 g/dL (ref 30.0–36.0)
MCV: 86.2 fL (ref 80.0–100.0)
Platelets: 281 10*3/uL (ref 150–400)
RBC: 5.08 MIL/uL (ref 3.87–5.11)
RDW: 12.7 % (ref 11.5–15.5)
WBC: 6.2 10*3/uL (ref 4.0–10.5)
nRBC: 0 % (ref 0.0–0.2)

## 2021-03-04 LAB — GLUCOSE, CAPILLARY: Glucose-Capillary: 149 mg/dL — ABNORMAL HIGH (ref 70–99)

## 2021-03-04 SURGERY — LAPAROSCOPIC CHOLECYSTECTOMY
Anesthesia: General

## 2021-03-04 MED ORDER — LACTATED RINGERS IV SOLN: INTRAVENOUS | Status: DC

## 2021-03-04 MED ORDER — OXYCODONE HCL 5 MG/5ML PO SOLN: 5.0000 mg | Freq: Once | ORAL | Status: AC | PRN

## 2021-03-04 MED ORDER — LIDOCAINE 2% (20 MG/ML) 5 ML SYRINGE
INTRAMUSCULAR | Status: DC | PRN
  Administered 2021-03-04: 100 mg via INTRAVENOUS

## 2021-03-04 MED ORDER — BUPIVACAINE HCL 0.25 % IJ SOLN
INTRAMUSCULAR | Status: DC | PRN
  Administered 2021-03-04: 8 mL

## 2021-03-04 MED ORDER — PROPOFOL 10 MG/ML IV BOLUS
INTRAVENOUS | Status: DC | PRN
  Administered 2021-03-04: 200 mg via INTRAVENOUS

## 2021-03-04 MED ORDER — ONDANSETRON HCL 4 MG/2ML IJ SOLN: 4.0000 mg | Freq: Once | INTRAMUSCULAR | Status: DC | PRN

## 2021-03-04 MED ORDER — OXYCODONE HCL 5 MG PO TABS: 5.0000 mg | ORAL_TABLET | Freq: Once | ORAL | Status: AC | PRN

## 2021-03-04 MED ORDER — KETOROLAC TROMETHAMINE 15 MG/ML IJ SOLN
INTRAMUSCULAR | Status: DC | PRN
  Administered 2021-03-04: 15 mg via INTRAVENOUS

## 2021-03-04 MED ORDER — ACETAMINOPHEN 10 MG/ML IV SOLN
INTRAVENOUS | Status: DC | PRN
  Administered 2021-03-04: 1000 mg via INTRAVENOUS

## 2021-03-04 MED ORDER — HYDROMORPHONE HCL 1 MG/ML IJ SOLN
INTRAMUSCULAR | Status: AC
  Filled 2021-03-04: qty 1

## 2021-03-04 MED ORDER — MIDAZOLAM HCL 2 MG/2ML IJ SOLN
INTRAMUSCULAR | Status: AC
  Filled 2021-03-04: qty 2

## 2021-03-04 MED ORDER — CEFAZOLIN SODIUM-DEXTROSE 2-3 GM-%(50ML) IV SOLR
INTRAVENOUS | Status: DC | PRN
  Administered 2021-03-04: 2 g via INTRAVENOUS

## 2021-03-04 MED ORDER — GLYCOPYRROLATE 0.2 MG/ML IJ SOLN
INTRAMUSCULAR | Status: DC | PRN
  Administered 2021-03-04: .2 mg via INTRAVENOUS

## 2021-03-04 MED ORDER — MIDAZOLAM HCL 2 MG/2ML IJ SOLN
INTRAMUSCULAR | Status: DC | PRN
  Administered 2021-03-04: 2 mg via INTRAVENOUS

## 2021-03-04 MED ORDER — OXYCODONE HCL 5 MG PO TABS
ORAL_TABLET | ORAL | Status: AC
  Administered 2021-03-04: 5 mg via ORAL
  Filled 2021-03-04: qty 1

## 2021-03-04 MED ORDER — FENTANYL CITRATE (PF) 250 MCG/5ML IJ SOLN
INTRAMUSCULAR | Status: AC
  Filled 2021-03-04: qty 5

## 2021-03-04 MED ORDER — CEFAZOLIN SODIUM-DEXTROSE 2-4 GM/100ML-% IV SOLN
INTRAVENOUS | Status: AC
  Filled 2021-03-04: qty 100

## 2021-03-04 MED ORDER — BUPIVACAINE-EPINEPHRINE (PF) 0.25% -1:200000 IJ SOLN
INTRAMUSCULAR | Status: AC
  Filled 2021-03-04: qty 30

## 2021-03-04 MED ORDER — FENTANYL CITRATE (PF) 250 MCG/5ML IJ SOLN
INTRAMUSCULAR | Status: DC | PRN
  Administered 2021-03-04: 25 ug via INTRAVENOUS
  Administered 2021-03-04 (×2): 50 ug via INTRAVENOUS
  Administered 2021-03-04: 25 ug via INTRAVENOUS

## 2021-03-04 MED ORDER — ONDANSETRON HCL 4 MG/2ML IJ SOLN
INTRAMUSCULAR | Status: DC | PRN
  Administered 2021-03-04: 4 mg via INTRAVENOUS

## 2021-03-04 MED ORDER — ORAL CARE MOUTH RINSE: 15.0000 mL | Freq: Once | OROMUCOSAL | Status: AC

## 2021-03-04 MED ORDER — FENTANYL CITRATE (PF) 100 MCG/2ML IJ SOLN
25.0000 ug | INTRAMUSCULAR | Status: DC | PRN
  Administered 2021-03-04 (×2): 25 ug via INTRAVENOUS

## 2021-03-04 MED ORDER — KETOROLAC TROMETHAMINE 30 MG/ML IJ SOLN: 30.0000 mg | Freq: Once | INTRAMUSCULAR | Status: DC | PRN

## 2021-03-04 MED ORDER — SODIUM CHLORIDE 0.9 % IR SOLN
Status: DC | PRN
  Administered 2021-03-04: 1000 mL

## 2021-03-04 MED ORDER — DEXAMETHASONE SODIUM PHOSPHATE 10 MG/ML IJ SOLN
INTRAMUSCULAR | Status: DC | PRN
  Administered 2021-03-04: 5 mg via INTRAVENOUS

## 2021-03-04 MED ORDER — EPHEDRINE SULFATE 50 MG/ML IJ SOLN
INTRAMUSCULAR | Status: DC | PRN
  Administered 2021-03-04: 25 mg via INTRAVENOUS

## 2021-03-04 MED ORDER — SUGAMMADEX SODIUM 200 MG/2ML IV SOLN
INTRAVENOUS | Status: DC | PRN
  Administered 2021-03-04: 150 mg via INTRAVENOUS

## 2021-03-04 MED ORDER — ROCURONIUM BROMIDE 100 MG/10ML IV SOLN
INTRAVENOUS | Status: DC | PRN
  Administered 2021-03-04: 70 mg via INTRAVENOUS

## 2021-03-04 MED ORDER — FENTANYL CITRATE (PF) 100 MCG/2ML IJ SOLN
INTRAMUSCULAR | Status: AC
  Administered 2021-03-04: 50 ug via INTRAVENOUS
  Filled 2021-03-04: qty 2

## 2021-03-04 MED ORDER — MEPERIDINE HCL 25 MG/ML IJ SOLN: 6.2500 mg | INTRAMUSCULAR | Status: DC | PRN

## 2021-03-04 MED ORDER — TRAMADOL HCL 50 MG PO TABS: 50.0000 mg | ORAL_TABLET | Freq: Four times a day (QID) | ORAL | 0 refills | Status: DC | PRN

## 2021-03-04 MED ORDER — HYDROMORPHONE HCL 1 MG/ML IJ SOLN
0.2500 mg | INTRAMUSCULAR | Status: DC | PRN
  Administered 2021-03-04: 0.5 mg via INTRAVENOUS

## 2021-03-04 MED ORDER — CHLORHEXIDINE GLUCONATE 0.12 % MT SOLN
15.0000 mL | Freq: Once | OROMUCOSAL | Status: AC
  Administered 2021-03-04: 15 mL via OROMUCOSAL
  Filled 2021-03-04: qty 15

## 2021-03-04 MED ORDER — 0.9 % SODIUM CHLORIDE (POUR BTL) OPTIME
TOPICAL | Status: DC | PRN
  Administered 2021-03-04: 1000 mL

## 2021-03-04 SURGICAL SUPPLY — 43 items
BAG COUNTER SPONGE SURGICOUNT (BAG) IMPLANT
CANISTER SUCT 3000ML PPV (MISCELLANEOUS) ×2 IMPLANT
CHLORAPREP W/TINT 26 (MISCELLANEOUS) ×2 IMPLANT
CLIP LIGATING HEMO O LOK GREEN (MISCELLANEOUS) ×2 IMPLANT
CNTNR URN SCR LID CUP LEK RST (MISCELLANEOUS) ×1 IMPLANT
CONT SPEC 4OZ STRL OR WHT (MISCELLANEOUS) ×2
COVER SURGICAL LIGHT HANDLE (MISCELLANEOUS) ×2 IMPLANT
COVER TRANSDUCER ULTRASND (DRAPES) IMPLANT
DERMABOND ADVANCED (GAUZE/BANDAGES/DRESSINGS) ×1
DERMABOND ADVANCED .7 DNX12 (GAUZE/BANDAGES/DRESSINGS) ×1 IMPLANT
ELECT REM PT RETURN 9FT ADLT (ELECTROSURGICAL) ×2
ELECTRODE REM PT RTRN 9FT ADLT (ELECTROSURGICAL) ×1 IMPLANT
GLOVE SURG ENC MOIS LTX SZ7.5 (GLOVE) IMPLANT
GLOVE SURG SYN 7.5  E (GLOVE) ×2
GLOVE SURG SYN 7.5 E (GLOVE) ×1 IMPLANT
GLOVE SURG UNDER POLY LF SZ6.5 (GLOVE) ×4 IMPLANT
GLOVE SURG UNDER POLY LF SZ7 (GLOVE) ×2 IMPLANT
GOWN STRL REUS W/ TWL LRG LVL3 (GOWN DISPOSABLE) ×3 IMPLANT
GOWN STRL REUS W/ TWL XL LVL3 (GOWN DISPOSABLE) ×1 IMPLANT
GOWN STRL REUS W/TWL LRG LVL3 (GOWN DISPOSABLE) ×6
GOWN STRL REUS W/TWL XL LVL3 (GOWN DISPOSABLE) ×2
GRASPER SUT TROCAR 14GX15 (MISCELLANEOUS) ×2 IMPLANT
KIT BASIN OR (CUSTOM PROCEDURE TRAY) ×2 IMPLANT
KIT TURNOVER KIT B (KITS) ×2 IMPLANT
NEEDLE INSUFFLATION 14GA 120MM (NEEDLE) ×2 IMPLANT
NS IRRIG 1000ML POUR BTL (IV SOLUTION) ×2 IMPLANT
PAD ARMBOARD 7.5X6 YLW CONV (MISCELLANEOUS) ×4 IMPLANT
POUCH LAPAROSCOPIC INSTRUMENT (MISCELLANEOUS) ×2 IMPLANT
POUCH RETRIEVAL ECOSAC 10 (ENDOMECHANICALS) ×1 IMPLANT
POUCH RETRIEVAL ECOSAC 10MM (ENDOMECHANICALS) ×2
SCISSORS LAP 5X35 DISP (ENDOMECHANICALS) ×2 IMPLANT
SET IRRIG TUBING LAPAROSCOPIC (IRRIGATION / IRRIGATOR) ×2 IMPLANT
SET TUBE SMOKE EVAC HIGH FLOW (TUBING) ×2 IMPLANT
SLEEVE ENDOPATH XCEL 5M (ENDOMECHANICALS) ×4 IMPLANT
SPECIMEN JAR SMALL (MISCELLANEOUS) IMPLANT
SUT MNCRL AB 4-0 PS2 18 (SUTURE) ×2 IMPLANT
TOWEL GREEN STERILE (TOWEL DISPOSABLE) ×2 IMPLANT
TOWEL GREEN STERILE FF (TOWEL DISPOSABLE) ×2 IMPLANT
TRAY LAPAROSCOPIC MC (CUSTOM PROCEDURE TRAY) ×2 IMPLANT
TROCAR XCEL NON-BLD 11X100MML (ENDOMECHANICALS) ×2 IMPLANT
TROCAR XCEL NON-BLD 5MMX100MML (ENDOMECHANICALS) ×2 IMPLANT
WARMER LAPAROSCOPE (MISCELLANEOUS) ×2 IMPLANT
WATER STERILE IRR 1000ML POUR (IV SOLUTION) IMPLANT

## 2021-03-04 NOTE — Anesthesia Procedure Notes (Signed)
Procedure Name: Intubation Date/Time: 03/04/2021 7:32 AM Performed by: Stanton Kidney, CRNA Pre-anesthesia Checklist: Patient identified, Patient being monitored, Timeout performed, Emergency Drugs available and Suction available Patient Re-evaluated:Patient Re-evaluated prior to induction Oxygen Delivery Method: Circle system utilized Preoxygenation: Pre-oxygenation with 100% oxygen Induction Type: IV induction Ventilation: Mask ventilation without difficulty Laryngoscope Size: 3 and Glidescope Grade View: Grade I Tube type: Oral Tube size: 7.0 mm Number of attempts: 2 (grade 2 view with miller 3, grade 1 view glide go) Airway Equipment and Method: Stylet Placement Confirmation: ETT inserted through vocal cords under direct vision, positive ETCO2 and breath sounds checked- equal and bilateral Secured at: 21 cm Tube secured with: Tape Dental Injury: Teeth and Oropharynx as per pre-operative assessment  Difficulty Due To: Difficult Airway- due to anterior larynx

## 2021-03-04 NOTE — Interval H&P Note (Signed)
History and Physical Interval Note:  03/04/2021 7:25 AM  Traci Young  has presented today for surgery, with the diagnosis of GALLSTONES.  The various methods of treatment have been discussed with the patient and family. After consideration of risks, benefits and other options for treatment, the patient has consented to  Procedure(s): LAPAROSCOPIC CHOLECYSTECTOMY (N/A) as a surgical intervention.  The patient's history has been reviewed, patient examined, no change in status, stable for surgery.  I have reviewed the patient's chart and labs.  Questions were answered to the patient's satisfaction.     Axel Filler

## 2021-03-04 NOTE — Anesthesia Postprocedure Evaluation (Addendum)
Anesthesia Post Note  Patient: Traci Young  Procedure(s) Performed: LAPAROSCOPIC CHOLECYSTECTOMY     Patient location during evaluation: Phase II Anesthesia Type: General Level of consciousness: awake Pain management: pain level controlled Vital Signs Assessment: post-procedure vital signs reviewed and stable Respiratory status: spontaneous breathing Cardiovascular status: stable Postop Assessment: no apparent nausea or vomiting Anesthetic complications: no   No notable events documented.  Last Vitals:  Vitals:   03/04/21 0945 03/04/21 0950  BP:  131/69  Pulse: 70 71  Resp: 14 11  Temp:  36.6 C  SpO2: 98% 95%    Last Pain:  Vitals:   03/04/21 0945  TempSrc:   PainSc: Asleep                 Caren Macadam

## 2021-03-04 NOTE — Transfer of Care (Signed)
Immediate Anesthesia Transfer of Care Note  Patient: Traci Young  Procedure(s) Performed: LAPAROSCOPIC CHOLECYSTECTOMY  Patient Location: PACU  Anesthesia Type:General  Level of Consciousness: awake, drowsy and patient cooperative  Airway & Oxygen Therapy: Patient Spontanous Breathing  Post-op Assessment: Report given to RN and Post -op Vital signs reviewed and stable  Post vital signs: Reviewed and stable  Last Vitals:  Vitals Value Taken Time  BP 112/50 03/04/21 0850  Temp 36.1 C 03/04/21 0850  Pulse 76 03/04/21 0856  Resp 17 03/04/21 0856  SpO2 99 % 03/04/21 0856  Vitals shown include unvalidated device data.  Last Pain:  Vitals:   03/04/21 0850  TempSrc:   PainSc: Asleep         Complications: No notable events documented.

## 2021-03-04 NOTE — Op Note (Signed)
Laparoscopic Cholecystectomy Procedure Note  Indications: This patient presents with symptomatic gallbladder disease and will undergo laparoscopic cholecystectomy.  Pre-operative Diagnosis: Biliary cholic  Post-operative Diagnosis: Biliary cholic  Surgeon: Axel Filler, MD  Assistants: Basilio Cairo, MD  Anesthesia: General endotracheal anesthesia  ASA Class: 2  Findings: Distend gallbladder, no evidence of acute cholecystitis  Details of the Procedure:  The patient was taken to the operating and placed in the supine position with bilateral SCDs in place. A time out was called and all facts were verified. A pneumoperitoneum was obtained via A Veress needle technique to a pressure of 63mm of mercury. A 87mm trochar was then placed in the right upper quadrant. There were no injuries to any abdominal organs. A 11 mm port was then placed in the umbilical region after infiltrating with local anesthesia under direct visualization. Two additional 54mm trochars were placed under direct visualization (one in the epigastric location and the other in the right subcostal midaxillary line). A total of 8cc 0.25% marcaine with epinephrine was used while placing trochars.   The gallbladder was identified and retracted, the peritoneum was then sharply dissected from the gallbladder and this dissection was carried down to Calot's triangle. The cystic duct was identified and dissected circumferentially and seen going into the gallbladder 360.  The cystic artery was dissected away from the surrounding tissues.   The critical angle was obtained.  2 clips were placed proximally one distally and the cystic duct transected. The cystic artery was identified and 2 clips placed proximally and one distally and transected. We then proceeded to remove the gallbladder off the hepatic fossa with Bovie cautery. A retrieval bag was then placed in the abdomen and gallbladder placed in the bag. The hepatic fossa was then  reexamined and hemostasis was achieved with Bovie cautery and was excellent at this portion of the case. The subhepatic fossa and perihepatic fossa was then irrigated until the effluent was clear. The specimen bag and specimen were removed from the abdominal cavity.  The 11 mm trocar fascia was reapproximated with the Endo Close #1 Vicryl x2. The pneumoperitoneum was evacuated and all trochars removed under direct visulalization. The skin was then closed with 4-0 Monocryl and the skin dressed with Dermabond. The patient was awaken from general anesthesia and taken to the recovery room in stable condition.  Estimated Blood Loss:  5cc         Specimens: Gallbladder           Complications: None; patient tolerated the procedure well.         Disposition: PACU - hemodynamically stable.         Condition: stable

## 2021-03-04 NOTE — Discharge Instructions (Signed)
[facility] ______[facility] LAPAROSCOPIC SURGERY: POST OP INSTRUCTIONS Always review your discharge instruction sheet given to you by the facility where your surgery was performed. IF YOU HAVE DISABILITY OR FAMILY LEAVE FORMS, YOU MUST BRING THEM TO THE OFFICE FOR PROCESSING.   DO NOT GIVE THEM TO YOUR DOCTOR.  A prescription for pain medication may be given to you upon discharge.  Take your pain medication as prescribed, if needed.  If narcotic pain medicine is not needed, then you may take acetaminophen (Tylenol) or ibuprofen (Advil) as needed. Take your usually prescribed medications unless otherwise directed. If you need a refill on your pain medication, please contact your pharmacy.  They will contact our office to request authorization. Prescriptions will not be filled after 5pm or on week-ends. You should follow a light diet the first few days after arrival home, such as soup and crackers, etc.  Be sure to include lots of fluids daily. Most patients will experience some swelling and bruising in the area of the incisions.  Ice packs will help.  Swelling and bruising can take several days to resolve.  It is common to experience some constipation if taking pain medication after surgery.  Increasing fluid intake and taking a stool softener (such as Colace) will usually help or prevent this problem from occurring.  A mild laxative (Milk of Magnesia or Miralax) should be taken according to package instructions if there are no bowel movements after 48 hours. Unless discharge instructions indicate otherwise, you may remove your bandages 24-48 hours after surgery, and you may shower at that time.  You may have steri-strips (small skin tapes) in place directly over the incision.  These strips should be left on the skin for 7-10 days.  If your surgeon used skin glue on the incision, you may shower in 24 hours.  The glue will flake off over the next 2-3 weeks.  Any sutures or staples will be  removed at the office during your follow-up visit. ACTIVITIES:  You may resume regular (light) daily activities beginning the next day--such as daily self-care, walking, climbing stairs--gradually increasing activities as tolerated.  You may have sexual intercourse when it is comfortable.  Refrain from any heavy lifting or straining until approved by your doctor. You may drive when you are no longer taking prescription pain medication, you can comfortably wear a seatbelt, and you can safely maneuver your car and apply brakes. RETURN TO WORK:  __________________________________________________________ You should see your doctor in the office for a follow-up appointment approximately 2-3 weeks after your surgery.  Make sure that you call for this appointment within a day or two after you arrive home to insure a convenient appointment time. OTHER INSTRUCTIONS: __________________________________________________________________________________________________________________________ __________________________________________________________________________________________________________________________ WHEN TO CALL YOUR DOCTOR: Fever over 101.0 Inability to urinate Continued bleeding from incision. Increased pain, redness, or drainage from the incision. Increasing abdominal pain  The clinic staff is available to answer your questions during regular business hours.  Please don't hesitate to call and ask to speak to one of the nurses for clinical concerns.  If you have a medical emergency, go to the nearest emergency room or call 911.  A surgeon from [facility] is always on call at the hospital. [address], [address], [place], [place]  [address] ? [address] [address], [place], [place]   [address] [phone] ? [phone] ? FAX [phone] Web site: [email or web address]  

## 2021-03-05 ENCOUNTER — Encounter (HOSPITAL_COMMUNITY): Payer: Self-pay | Admitting: General Surgery

## 2021-03-07 LAB — SURGICAL PATHOLOGY

## 2021-03-08 ENCOUNTER — Ambulatory Visit: Payer: 59 | Admitting: Physical Therapy

## 2021-03-09 ENCOUNTER — Ambulatory Visit: Payer: 59 | Admitting: Rehabilitative and Restorative Service Providers"

## 2021-03-14 ENCOUNTER — Ambulatory Visit: Payer: Self-pay

## 2021-03-14 ENCOUNTER — Ambulatory Visit: Payer: 59 | Admitting: Physical Medicine and Rehabilitation

## 2021-03-14 ENCOUNTER — Other Ambulatory Visit: Payer: Self-pay

## 2021-03-14 ENCOUNTER — Encounter: Payer: Self-pay | Admitting: Physical Medicine and Rehabilitation

## 2021-03-14 DIAGNOSIS — G8929 Other chronic pain: Secondary | ICD-10-CM

## 2021-03-14 NOTE — Progress Notes (Signed)
Pt state right shoulder pain. Pt state lifting her arm and sitting for a long time makes the pain worse. Pt state she takes over the counter pain meds to help ease her pain.  Numeric Pain Rating Scale and Functional Assessment Average Pain 7   In the last MONTH (on 0-10 scale) has pain interfered with the following?  1. General activity like being  able to carry out your everyday physical activities such as walking, climbing stairs, carrying groceries, or moving a chair?  Rating(10)   +Driver, -BT, -Dye Allergies.

## 2021-03-14 NOTE — Progress Notes (Signed)
   Traci Young Reisha Wos - 60 y.o. female MRN [id number]  Date of birth: 1960-06-20  Office Visit Note: Visit Date: 03/14/2021 PCP: Avanell Shackleton, PA-C Referred by: Avanell Shackleton, PA-C  Subjective: Chief Complaint  Patient presents with   Right Shoulder - Pain   HPI:  Traci Young Traci Young is a 60 y.o. female who comes in today at the request of Dr. Glee Arvin for planned Right anesthetic glenohumeral arthrogram with fluoroscopic guidance.  The patient has failed conservative care including home exercise, medications, time and activity modification.  This injection will be diagnostic and hopefully therapeutic.  Please see requesting physician notes for further details and justification.   ROS Otherwise per HPI.  Assessment & Plan: Visit Diagnoses:    ICD-10-CM   1. Chronic right shoulder pain  M25.511 XR C-ARM NO REPORT   G89.29       Plan: No additional findings.   Meds & Orders: No orders of the defined types were placed in this encounter.   Orders Placed This Encounter  Procedures   XR C-ARM NO REPORT    Follow-up: Return if symptoms worsen or fail to improve.   Procedures: No procedures performed      Clinical History: No specialty comments available.     Objective:  VS:  HT:    WT:   BMI:     BP:   HR: bpm  TEMP: ( )  RESP:  Physical Exam   Imaging: XR C-ARM NO REPORT  Result Date: 03/14/2021 Please see Notes tab for imaging impression.

## 2021-03-18 ENCOUNTER — Ambulatory Visit (INDEPENDENT_AMBULATORY_CARE_PROVIDER_SITE_OTHER): Payer: 59 | Admitting: Rehabilitative and Restorative Service Providers"

## 2021-03-18 ENCOUNTER — Encounter: Payer: Self-pay | Admitting: Rehabilitative and Restorative Service Providers"

## 2021-03-18 ENCOUNTER — Other Ambulatory Visit: Payer: Self-pay

## 2021-03-18 DIAGNOSIS — G8929 Other chronic pain: Secondary | ICD-10-CM

## 2021-03-18 DIAGNOSIS — M25511 Pain in right shoulder: Secondary | ICD-10-CM

## 2021-03-18 DIAGNOSIS — M25611 Stiffness of right shoulder, not elsewhere classified: Secondary | ICD-10-CM | POA: Diagnosis not present

## 2021-03-18 DIAGNOSIS — R600 Localized edema: Secondary | ICD-10-CM

## 2021-03-18 DIAGNOSIS — M6281 Muscle weakness (generalized): Secondary | ICD-10-CM

## 2021-03-18 NOTE — Therapy (Signed)
[facility] [address] [place], [place], [address] Phone: [phone]   Fax:  [phone]  Physical Therapy Evaluation  Patient Details  Name: Traci Young MRN: [id number] Date of Birth: Jan 07, 1961 Referring Provider (PT): Tarry Kos MD   Encounter Date: 03/18/2021   PT End of Session - 03/18/21 1640     Visit Number 1    Number of Visits 16    Date for PT Re-Evaluation 05/13/21    Progress Note Due on Visit 10    PT Start Time 1515    PT Stop Time 1600    PT Time Calculation (min) 45 min    Activity Tolerance Patient tolerated treatment well    Behavior During Therapy [facility] for tasks assessed/performed             Past Medical History:  Diagnosis Date   Diabetes mellitus, new onset (HCC) 05/19/2019   Hgb A1c >14%   Elevated lipids    no meds, diet  controlled   Obesity    Osteopenia determined by x-ray 09/01/2020   SVD (spontaneous vaginal delivery)    x 4    Past Surgical History:  Procedure Laterality Date   CHOLECYSTECTOMY N/A 03/04/2021   Procedure: LAPAROSCOPIC CHOLECYSTECTOMY;  Surgeon: Axel Filler, MD;  Location: [facility] OR;  Service: General;  Laterality: N/A;   ENDOVENOUS ABLATION SAPHENOUS VEIN W/ LASER Right 04/24/2018   endovenous laser ablation right greater saphenous vein and stab phlebectomy > 20 incisions right leg by Fabienne Bruns MD    SHOULDER SURGERY Right    TUBAL LIGATION      There were no vitals filed for this visit.    Subjective Assessment - 03/18/21 1635     Subjective Myrtice had a previous R RTC repair in 2012 or 2013 in [place].  Recent MRI shows a torn and retracted supraspinatus.    Patient is accompained by: Interpreter    Pertinent History DM and hyperlipidemia.  Previous R RTC repair.    Limitations Lifting;House hold activities    Patient Stated Goals Be able to use the R arm without pain and sleep without R shoulder keeping her up.    Currently in Pain? Yes     Pain Score 5     Pain Location Shoulder    Pain Orientation Right    Pain Descriptors / Indicators Aching;Sharp;Constant    Pain Type Chronic pain    Pain Radiating Towards Lateral arm to above the elbow    Pain Onset More than a month ago    Pain Frequency Constant    Aggravating Factors  Sleep and R UE use    Pain Relieving Factors Ice    Effect of Pain on Daily Activities Sleep is interrupted and limited R UE reaching and overhead use    Multiple Pain Sites No                [facility] PT Assessment - 03/18/21 0001       Assessment   Medical Diagnosis Chronic R shoulder pain    Referring Provider (PT) Tarry Kos MD    Onset Date/Surgical Date --   2010-2011 surgery   Prior Therapy Previous R RTC repair      Precautions   Precautions None      Restrictions   Weight Bearing Restrictions No      Balance Screen   Has the patient fallen in the past 6 months No    Has the patient had  a decrease in activity level because of a fear of falling?  No    Is the patient reluctant to leave their home because of a fear of falling?  No      Prior Function   Level of Independence Independent      Cognition   Overall Cognitive Status Within Functional Limits for tasks assessed      Observation/Other Assessments   Focus on Therapeutic Outcomes (FOTO)  39 (Goal 70 by visit 16)      ROM / Strength   AROM / PROM / Strength AROM;Strength      AROM   Overall AROM  Deficits    AROM Assessment Site Shoulder    Right/Left Shoulder Left;Right    Right Shoulder Flexion 120 Degrees    Right Shoulder Internal Rotation 55 Degrees    Right Shoulder External Rotation 75 Degrees    Right Shoulder Horizontal  ADduction 40 Degrees    Left Shoulder Flexion 175 Degrees    Left Shoulder Internal Rotation 80 Degrees    Left Shoulder External Rotation 85 Degrees    Left Shoulder Horizontal ADduction 40 Degrees      Strength   Overall Strength Deficits    Strength Assessment Site Shoulder     Right/Left Shoulder Left;Right    Right Shoulder Internal Rotation --   12.5 pounds   Right Shoulder External Rotation --   7.0 pounds   Left Shoulder Internal Rotation --   20.0 pounds   Left Shoulder External Rotation --   16.5 pounds                       Objective measurements completed on examination: See above findings.       [facility] Adult PT Treatment/Exercise - 03/18/21 0001       Therapeutic Activites    Therapeutic Activities Other Therapeutic Activities    Other Therapeutic Activities Discussed options with PT, possible referral back to Dr. Roda Shutters for 2nd RTC or other (reverse TSA) options if failure of conservative care with a re-tear of a previous repair.      Exercises   Exercises Shoulder      Shoulder Exercises: Supine   Protraction Strengthening;Right;20 reps;Limitations    Protraction Limitations Reach up with palm in, 3 second hold      Shoulder Exercises: Standing   External Rotation Strengthening;Right;10 reps;Theraband;Limitations    Theraband Level (Shoulder External Rotation) Level 2 (Red)    External Rotation Limitations 2 sets slow eccentrics    Retraction Strengthening;Both;10 reps;Limitations    Retraction Limitations 5 seconds (shoulder blade pinches)                     PT Education - 03/18/21 1639     Education Details Discussed imaging and what we can do in physical therapy (improve AROM, strength, function).  Let Neshell know that if pain and function are relatively unchanged in 4 weeks she should follow-up with Dr. Roda Shutters and ask about a revision, TSA or other options.  Started HEP.    Person(s) Educated Patient    Methods Explanation;Demonstration;Tactile cues;Verbal cues;Handout    Comprehension Verbalized understanding;Tactile cues required;Need further instruction;Returned demonstration;Verbal cues required              PT Short Term Goals - 03/18/21 1645       PT SHORT TERM GOAL #1   Title Katyana will have R  shoulder AROM (supine) for flexion to 150 degrees, ER to  90 degrees, IR to 60 degrees and horizontal adduction to 40 degrees.    Baseline 120/75/55/40 respectively    Time 4    Period Weeks    Status New    Target Date 04/15/21               PT Long Term Goals - 03/18/21 1647       PT LONG TERM GOAL #1   Title Improve standing shoulder flexion AROM to 170 degrees.    Baseline < 120 degrees    Time 8    Period Weeks    Status New    Target Date 05/13/21      PT LONG TERM GOAL #2   Title Improve R shoulder strength for ER to 10 and IR to 15 pounds.    Baseline 7.0 and 12.5 respectively    Time 8    Period Weeks    Status New    Target Date 05/13/21      PT LONG TERM GOAL #3   Title Improve FOTO to 70.    Baseline 39    Time 8    Period Weeks    Status New    Target Date 05/13/21      PT LONG TERM GOAL #4   Title Improve R shoulder pain to consistently 0-3/10 on the Numeric Pain Rating Scale.    Baseline Can be 5-7+/10    Time 8    Period Weeks    Status New    Target Date 05/13/21      PT LONG TERM GOAL #5   Title Teighan will be independent with her HEP at DC.    Baseline Assigned today.    Time 8    Period Weeks    Status New    Target Date 05/13/21                    Plan - 03/18/21 1641     Clinical Impression Statement Keondria appears to have a re-tear of a previous (9-10 years ago) R supraspinatues tear.  Sleep and R UE function are affected.  I recommend a 4 week course of PT to address AROM, scapular and rotator cuff strength along with R shoulder function.  If pain and self-reported function are significantly better in 4 weeks, she may need as much as 8 weeks of supervised PT.  If minimal changes subjectively in the first 4 weeks, she will be appropriate for referral back to Dr. Roda Shutters for his recommendations.    Personal Factors and Comorbidities Comorbidity 3+    Comorbidities DM, hyperlipidemia and previous R RTC repair     Examination-Activity Limitations Reach Overhead;Dressing;Lift;Carry    Examination-Participation Restrictions Occupation;Cleaning;Community Activity;Meal Prep    Stability/Clinical Decision Making Stable/Uncomplicated    Clinical Decision Making Low    Rehab Potential Good    PT Frequency 2x / week    PT Duration 8 weeks    PT Treatment/Interventions ADLs/Self Care Home Management;Cryotherapy;Iontophoresis 4mg /ml Dexamethasone;Therapeutic activities;Neuromuscular re-education;Therapeutic exercise;Patient/family education;Manual techniques;Dry needling;Passive range of motion;Vasopneumatic Device    PT Next Visit Plan Review 3 HEP exercises, progress scapular and RTC strength along with capsular flexibility.    PT Home Exercise Plan Access Code: [email or web address]  URL: [email or web address]  Date: 03/18/2021  Prepared by: 13/08/2020    Exercises  Standing Scapular Retraction - 5 x daily - 7 x weekly - 1 sets - 5 reps - 5 second hold  Supine Scapular Protraction in Flexion with Dumbbells -  2-3 x daily - 7 x weekly - 1 sets - 20 reps - 3 seconds hold  Shoulder External Rotation with Anchored Resistance with Towel Under Elbow - 1 x daily - 7 x weekly - 2-3 sets - 10 reps - 3 seconds hold    Consulted and Agree with Plan of Care Patient             Patient will benefit from skilled therapeutic intervention in order to improve the following deficits and impairments:  Decreased endurance, Decreased range of motion, Decreased strength, Increased edema, Impaired perceived functional ability, Impaired UE functional use, Pain  Visit Diagnosis: Muscle weakness (generalized)  Stiffness of right shoulder, not elsewhere classified  Chronic right shoulder pain  Localized edema     Problem List Patient Active Problem List   Diagnosis Date Noted   Chronic right shoulder pain 01/25/2021   History of shoulder surgery 01/25/2021   Limited range of motion (ROM) of shoulder 01/25/2021    Osteopenia determined by x-ray 09/01/2020   Hyperlipidemia associated with type 2 diabetes mellitus (HCC) 04/05/2020   Changes in vision 06/03/2019   Diabetes mellitus, new onset (HCC) 05/19/2019   Elevated ALT measurement 05/19/2019   Unexplained weight loss 05/19/2019   Abnormal appearance of cervix 05/19/2019   Language barrier 05/19/2019   HYPERCHOLESTEROLEMIA 03/15/2009   OVERWEIGHT 03/15/2009   BRADYCARDIA 03/15/2009   CHEST PAIN 03/15/2009    Cherlyn Cushing, PT, MPT 03/18/2021, 4:52 PM  [facility] [address] [place], [place], [address] Phone: [phone]   Fax:  [phone]  Name: Emillie Chasen MRN: [id number] Date of Birth: Feb 18, 1961

## 2021-03-18 NOTE — Patient Instructions (Signed)
Access Code: [email or web address] URL: [email or web address] Date: 03/18/2021 Prepared by: Pauletta Browns  Exercises Standing Scapular Retraction - 5 x daily - 7 x weekly - 1 sets - 5 reps - 5 second hold Supine Scapular Protraction in Flexion with Dumbbells - 2-3 x daily - 7 x weekly - 1 sets - 20 reps - 3 seconds hold Shoulder External Rotation with Anchored Resistance with Towel Under Elbow - 1 x daily - 7 x weekly - 2-3 sets - 10 reps - 3 seconds hold

## 2021-03-21 ENCOUNTER — Telehealth: Payer: Self-pay

## 2021-03-21 MED ORDER — ATORVASTATIN CALCIUM 10 MG PO TABS: 10.0000 mg | ORAL_TABLET | Freq: Every morning | ORAL | 0 refills | Status: DC

## 2021-03-21 NOTE — Telephone Encounter (Signed)
Refill received via fax

## 2021-03-29 ENCOUNTER — Encounter: Payer: Self-pay | Admitting: Physical Therapy

## 2021-03-29 ENCOUNTER — Ambulatory Visit (INDEPENDENT_AMBULATORY_CARE_PROVIDER_SITE_OTHER): Payer: 59 | Admitting: Physical Therapy

## 2021-03-29 ENCOUNTER — Other Ambulatory Visit: Payer: Self-pay

## 2021-03-29 DIAGNOSIS — M6281 Muscle weakness (generalized): Secondary | ICD-10-CM | POA: Diagnosis not present

## 2021-03-29 DIAGNOSIS — R600 Localized edema: Secondary | ICD-10-CM | POA: Diagnosis not present

## 2021-03-29 DIAGNOSIS — M25611 Stiffness of right shoulder, not elsewhere classified: Secondary | ICD-10-CM

## 2021-03-29 DIAGNOSIS — M25511 Pain in right shoulder: Secondary | ICD-10-CM

## 2021-03-29 DIAGNOSIS — G8929 Other chronic pain: Secondary | ICD-10-CM

## 2021-03-29 NOTE — Therapy (Signed)
[facility] Physical Therapy [address] [place], [place], [address] Phone: [phone]   Fax:  [phone]  Physical Therapy Treatment  Patient Details  Name: Traci Young MRN: [id number] Date of Birth: 1960-11-17 Referring Provider (PT): Tarry Kos MD   Encounter Date: 03/29/2021   PT End of Session - 03/29/21 1526     Visit Number 2    Number of Visits 16    Date for PT Re-Evaluation 05/13/21    Progress Note Due on Visit 10    PT Start Time 1517    PT Stop Time 1558    PT Time Calculation (min) 41 min    Activity Tolerance Patient tolerated treatment well    Behavior During Therapy [facility] for tasks assessed/performed             Past Medical History:  Diagnosis Date   Diabetes mellitus, new onset (HCC) 05/19/2019   Hgb A1c >14%   Elevated lipids    no meds, diet  controlled   Obesity    Osteopenia determined by x-ray 09/01/2020   SVD (spontaneous vaginal delivery)    x 4    Past Surgical History:  Procedure Laterality Date   CHOLECYSTECTOMY N/A 03/04/2021   Procedure: LAPAROSCOPIC CHOLECYSTECTOMY;  Surgeon: Axel Filler, MD;  Location: [facility] OR;  Service: General;  Laterality: N/A;   ENDOVENOUS ABLATION SAPHENOUS VEIN W/ LASER Right 04/24/2018   endovenous laser ablation right greater saphenous vein and stab phlebectomy > 20 incisions right leg by Fabienne Bruns MD    SHOULDER SURGERY Right    TUBAL LIGATION      There were no vitals filed for this visit.   Subjective Assessment - 03/29/21 1524     Subjective Pt stating her shoulder is about the same. Pt reporting 5/10 pain in her right shoulder.    Patient is accompained by: Interpreter    Pertinent History DM and hyperlipidemia.  Previous R RTC repair.    Limitations Lifting;House hold activities    Patient Stated Goals Be able to use the R arm without pain and sleep without R shoulder keeping her up.    Currently in Pain? Yes    Pain Score 5     Pain  Location Shoulder    Pain Orientation Right    Pain Descriptors / Indicators Aching;Tightness    Pain Type Chronic pain    Pain Onset More than a month ago    Pain Frequency Constant                               [facility] Adult PT Treatment/Exercise - 03/29/21 0001       Exercises   Exercises Shoulder      Shoulder Exercises: Standing   External Rotation Strengthening;Right;Theraband;Limitations;15 reps    Theraband Level (Shoulder External Rotation) Level 2 (Red)    External Rotation Limitations 2 sets    Internal Rotation Strengthening;Right;15 reps;Theraband    Theraband Level (Shoulder Internal Rotation) Level 3 (Green)    Extension Strengthening;Both;15 reps;Theraband    Theraband Level (Shoulder Extension) Level 2 (Red)    Extension Limitations 2 sets    Row Strengthening;Both;15 reps;Theraband    Theraband Level (Shoulder Row) Level 3 (Green)    Row Limitations 2 sets    Other Standing Exercises wall ladder in abduction position x 10    Other Standing Exercises ball stretch using red physioball holding 10 seconds each walking ball up and  down the wall each time.      Shoulder Exercises: ROM/Strengthening   UBE (Upper Arm Bike) L1.5 6 minutes (3 forward and 3 backward)    Ball on Wall yellow therapy ball circles for shoulder stability x 15 each direction      Manual Therapy   Manual therapy comments IASTM to right upper trap, mid trp and levator, Grade 2-3 GH mobs to R shoulder.                       PT Short Term Goals - 03/29/21 1611       PT SHORT TERM GOAL #1   Title Sharmel will have R shoulder AROM (supine) for flexion to 150 degrees, ER to 90 degrees, IR to 60 degrees and horizontal adduction to 40 degrees.    Baseline 120/75/55/40 respectively    Status On-going               PT Long Term Goals - 03/18/21 1647       PT LONG TERM GOAL #1   Title Improve standing shoulder flexion AROM to 170 degrees.    Baseline < 120  degrees    Time 8    Period Weeks    Status New    Target Date 05/13/21      PT LONG TERM GOAL #2   Title Improve R shoulder strength for ER to 10 and IR to 15 pounds.    Baseline 7.0 and 12.5 respectively    Time 8    Period Weeks    Status New    Target Date 05/13/21      PT LONG TERM GOAL #3   Title Improve FOTO to 70.    Baseline 39    Time 8    Period Weeks    Status New    Target Date 05/13/21      PT LONG TERM GOAL #4   Title Improve R shoulder pain to consistently 0-3/10 on the Numeric Pain Rating Scale.    Baseline Can be 5-7+/10    Time 8    Period Weeks    Status New    Target Date 05/13/21      PT LONG TERM GOAL #5   Title Chantea will be independent with her HEP at DC.    Baseline Assigned today.    Time 8    Period Weeks    Status New    Target Date 05/13/21                   Plan - 03/29/21 1609     Clinical Impression Statement Pt arriving to therapy today with 4/10 pain in her right shoulder. Pt tolerating all exercises well with mild pain reported with end ranage flexion, abd and ER. Pt reporting improvements in pain following mobs and IASTM to right shoulder of 2-3/10. Continue skilled PT to maximize pt's function.    Personal Factors and Comorbidities Comorbidity 3+    Comorbidities DM, hyperlipidemia and previous R RTC repair    Examination-Activity Limitations Reach Overhead;Dressing;Lift;Carry    Stability/Clinical Decision Making Stable/Uncomplicated    Rehab Potential Good    PT Frequency 2x / week    PT Duration 8 weeks    PT Treatment/Interventions ADLs/Self Care Home Management;Cryotherapy;Iontophoresis 4mg /ml Dexamethasone;Therapeutic activities;Neuromuscular re-education;Therapeutic exercise;Patient/family education;Manual techniques;Dry needling;Passive range of motion;Vasopneumatic Device    PT Next Visit Plan progress scapular and RTC strength along with capsular flexibility, Manual therapy to decrease pt's  pain    PT Home  Exercise Plan Access Code: [email or web address]  URL: [email or web address]  Date: 03/18/2021  Prepared by: Pauletta Browns    Exercises  Standing Scapular Retraction - 5 x daily - 7 x weekly - 1 sets - 5 reps - 5 second hold  Supine Scapular Protraction in Flexion with Dumbbells - 2-3 x daily - 7 x weekly - 1 sets - 20 reps - 3 seconds hold  Shoulder External Rotation with Anchored Resistance with Towel Under Elbow - 1 x daily - 7 x weekly - 2-3 sets - 10 reps - 3 seconds hold    Consulted and Agree with Plan of Care Patient             Patient will benefit from skilled therapeutic intervention in order to improve the following deficits and impairments:  Decreased endurance, Decreased range of motion, Decreased strength, Increased edema, Impaired perceived functional ability, Impaired UE functional use, Pain  Visit Diagnosis: Muscle weakness (generalized)  Stiffness of right shoulder, not elsewhere classified  Chronic right shoulder pain  Localized edema     Problem List Patient Active Problem List   Diagnosis Date Noted   Chronic right shoulder pain 01/25/2021   History of shoulder surgery 01/25/2021   Limited range of motion (ROM) of shoulder 01/25/2021   Osteopenia determined by x-ray 09/01/2020   Hyperlipidemia associated with type 2 diabetes mellitus (HCC) 04/05/2020   Changes in vision 06/03/2019   Diabetes mellitus, new onset (HCC) 05/19/2019   Elevated ALT measurement 05/19/2019   Unexplained weight loss 05/19/2019   Abnormal appearance of cervix 05/19/2019   Language barrier 05/19/2019   HYPERCHOLESTEROLEMIA 03/15/2009   OVERWEIGHT 03/15/2009   BRADYCARDIA 03/15/2009   CHEST PAIN 03/15/2009    Sharmon Leyden, PT, MPT 03/29/2021, 4:12 PM  [facility] Physical Therapy [address] [place], [place], [address] Phone: [phone]   Fax:  [phone]  Name: Aisa Schoeppner MRN: [id number] Date of Birth: 1960-06-14

## 2021-03-31 ENCOUNTER — Telehealth: Payer: Self-pay | Admitting: Family Medicine

## 2021-03-31 DIAGNOSIS — E119 Type 2 diabetes mellitus without complications: Secondary | ICD-10-CM

## 2021-03-31 MED ORDER — METFORMIN HCL 1000 MG PO TABS
1000.0000 mg | ORAL_TABLET | Freq: Two times a day (BID) | ORAL | 0 refills | Status: DC
Start: 2021-03-31 — End: 2021-06-10

## 2021-03-31 NOTE — Telephone Encounter (Signed)
Pt called and is needing a refill for her metformin

## 2021-04-01 ENCOUNTER — Ambulatory Visit (INDEPENDENT_AMBULATORY_CARE_PROVIDER_SITE_OTHER): Payer: 59 | Admitting: Rehabilitative and Restorative Service Providers"

## 2021-04-01 ENCOUNTER — Other Ambulatory Visit: Payer: Self-pay

## 2021-04-01 ENCOUNTER — Encounter: Payer: Self-pay | Admitting: Rehabilitative and Restorative Service Providers"

## 2021-04-01 DIAGNOSIS — R600 Localized edema: Secondary | ICD-10-CM

## 2021-04-01 DIAGNOSIS — M25611 Stiffness of right shoulder, not elsewhere classified: Secondary | ICD-10-CM | POA: Diagnosis not present

## 2021-04-01 DIAGNOSIS — M6281 Muscle weakness (generalized): Secondary | ICD-10-CM

## 2021-04-01 DIAGNOSIS — G8929 Other chronic pain: Secondary | ICD-10-CM

## 2021-04-01 DIAGNOSIS — M25511 Pain in right shoulder: Secondary | ICD-10-CM | POA: Diagnosis not present

## 2021-04-01 NOTE — Patient Instructions (Signed)
Access Code: [email or web address] URL: [email or web address] Date: 04/01/2021 Prepared by: Pauletta Browns  Exercises Standing Scapular Retraction - 5 x daily - 7 x weekly - 1 sets - 5 reps - 5 second hold Supine Scapular Protraction in Flexion with Dumbbells - 2-3 x daily - 7 x weekly - 1 sets - 20 reps - 3 seconds hold Shoulder External Rotation with Anchored Resistance with Towel Under Elbow - 1 x daily - 7 x weekly - 2-3 sets - 10 reps - 3 seconds hold

## 2021-04-01 NOTE — Therapy (Addendum)
[facility] Physical Therapy [address] [place], [place], [address] Phone: [phone]   Fax:  [phone]  Physical Therapy Treatment  Patient Details  Name: Traci Young MRN: [id number] Date of Birth: [birth year] Referring Provider (PT): Leandrew Koyanagi MD   Encounter Date: 04/01/2021  PHYSICAL THERAPY DISCHARGE SUMMARY  Visits from Start of Care: 3  Current functional level related to goals / functional outcomes: See note   Remaining deficits: See note   Education / Equipment: HEP   Patient agrees to discharge. Patient goals were partially met. Patient is being discharged due to not returning since the last visit.     PT End of Session - 04/01/21 1610     Visit Number 3    Number of Visits 16    Date for PT Re-Evaluation 05/13/21    Progress Note Due on Visit 10    PT Start Time 1516    PT Stop Time 8251    PT Time Calculation (min) 39 min    Activity Tolerance Patient tolerated treatment well    Behavior During Therapy [facility] for tasks assessed/performed             Past Medical History:  Diagnosis Date   Diabetes mellitus, new onset ([place]) 05/19/2019   Hgb A1c >14%   Elevated lipids    no meds, diet  controlled   Obesity    Osteopenia determined by x-ray 09/01/2020   SVD (spontaneous vaginal delivery)    x 4    Past Surgical History:  Procedure Laterality Date   CHOLECYSTECTOMY N/A 03/04/2021   Procedure: LAPAROSCOPIC CHOLECYSTECTOMY;  Surgeon: Ralene Ok, MD;  Location: [place];  Service: General;  Laterality: N/A;   ENDOVENOUS ABLATION SAPHENOUS VEIN W/ LASER Right 04/24/2018   endovenous laser ablation right greater saphenous vein and stab phlebectomy > 20 incisions right leg by Ruta Hinds MD    SHOULDER SURGERY Right    TUBAL LIGATION      There were no vitals filed for this visit.   Subjective Assessment - 04/01/21 1552     Subjective Traci Young reports good HEP compliance.  Pain is still limiting  although she notes better movement.  Sleeping is limited but improving.    Patient is accompained by: Interpreter    Pertinent History DM and hyperlipidemia.  Previous R RTC repair.    Limitations Lifting;House hold activities    Patient Stated Goals Be able to use the R arm without pain and sleep without R shoulder keeping her up.    Currently in Pain? Yes    Pain Score 4     Pain Location Shoulder    Pain Orientation Right    Pain Descriptors / Indicators Aching;Sore    Pain Type Chronic pain    Pain Radiating Towards Lateral arm    Pain Onset More than a month ago    Pain Frequency Constant    Aggravating Factors  R UE use    Pain Relieving Factors Ice and occasional tylenol    Effect of Pain on Daily Activities Limited R UE use with ADLs and occasionally (was frequently) limits sleep    Multiple Pain Sites No                [facility] PT Assessment - 04/01/21 0001       AROM   Right Shoulder Flexion 135 Degrees    Right Shoulder Internal Rotation 70 Degrees    Right Shoulder External Rotation 90 Degrees  Right Shoulder Horizontal  ADduction 40 Degrees                           [facility] Adult PT Treatment/Exercise - 04/01/21 0001       Therapeutic Activites    Therapeutic Activities Other Therapeutic Activities    Other Therapeutic Activities Worked a lot with scapular strengthening to assist reaching and progress to painfre overhead function      Exercises   Exercises Shoulder      Shoulder Exercises: Supine   Protraction Strengthening;Right;20 reps;Limitations    Protraction Weight (lbs) 3#    Protraction Limitations Reach up with palm in, 3 second hold      Shoulder Exercises: Sidelying   External Rotation Strengthening;Right;10 reps;Weights    External Rotation Weight (lbs) 1#    External Rotation Limitations slow eccentrics      Shoulder Exercises: Standing   External Rotation Strengthening;Right;10 reps;Theraband;Limitations    Theraband  Level (Shoulder External Rotation) Level 2 (Red)    External Rotation Limitations 2 sets slow eccentrics    Internal Rotation Strengthening;Right;15 reps;Theraband    Theraband Level (Shoulder Internal Rotation) Level 3 (Green)    Extension Strengthening;Both;10 reps;Theraband    Theraband Level (Shoulder Extension) Level 2 (Red)    Row Strengthening;Both;20 reps;Theraband    Theraband Level (Shoulder Row) Level 4 (Blue)    Retraction Strengthening;Both;10 reps;Limitations    Retraction Limitations 5 seconds (shoulder blade pinches)      Shoulder Exercises: ROM/Strengthening   UBE (Upper Arm Bike) Level 4 for 5 minutes (:20 Push/:20 Pull/:20 rest)                     PT Education - 04/01/21 1609     Education Details Reviewed exercises and importance of consistency for scapular and RTC strength gains to improve function.    Person(s) Educated Patient    Methods Explanation;Demonstration;Tactile cues;Verbal cues    Comprehension Tactile cues required;Verbalized understanding;Returned demonstration;Need further instruction;Verbal cues required              PT Short Term Goals - 04/01/21 1610       PT SHORT TERM GOAL #1   Title Traci Young will have R shoulder AROM (supine) for flexion to 150 degrees, ER to 90 degrees, IR to 60 degrees and horizontal adduction to 40 degrees.    Baseline 120/75/55/40 respectively at evaluation, 3/4 met 04/01/2021    Time 4    Period Weeks    Status Partially Met    Target Date 04/15/21               PT Long Term Goals - 03/18/21 1647       PT LONG TERM GOAL #1   Title Improve standing shoulder flexion AROM to 170 degrees.    Baseline < 120 degrees    Time 8    Period Weeks    Status New    Target Date 05/13/21      PT LONG TERM GOAL #2   Title Improve R shoulder strength for ER to 10 and IR to 15 pounds.    Baseline 7.0 and 12.5 respectively    Time 8    Period Weeks    Status New    Target Date 05/13/21      PT LONG  TERM GOAL #3   Title Improve FOTO to 70.    Baseline 39    Time 8    Period Weeks  Status New    Target Date 05/13/21      PT LONG TERM GOAL #4   Title Improve R shoulder pain to consistently 0-3/10 on the Numeric Pain Rating Scale.    Baseline Can be 5-7+/10    Time 8    Period Weeks    Status New    Target Date 05/13/21      PT LONG TERM GOAL #5   Title Traci Young will be independent with her HEP at DC.    Baseline Assigned today.    Time 8    Period Weeks    Status New    Target Date 05/13/21                   Plan - 04/01/21 1611     Clinical Impression Statement Traci Young notes better sleep and pain-free AROM.  She is still very limited with R UE functional use and can have her sleep disturbed by R shoulder pain.  Continue scapular and RTC strength emphasis to meet long-term goals.    Personal Factors and Comorbidities Comorbidity 3+    Comorbidities DM, hyperlipidemia and previous R RTC repair    Examination-Activity Limitations Reach Overhead;Dressing;Lift;Carry    Examination-Participation Restrictions Occupation;Cleaning;Community Activity;Meal Prep    Stability/Clinical Decision Making Stable/Uncomplicated    Rehab Potential Good    PT Frequency 2x / week    PT Duration 8 weeks    PT Treatment/Interventions ADLs/Self Care Home Management;Cryotherapy;Iontophoresis 12m/ml Dexamethasone;Therapeutic activities;Neuromuscular re-education;Therapeutic exercise;Patient/family education;Manual techniques;Dry needling;Passive range of motion;Vasopneumatic Device    PT Next Visit Plan Scapular and RTC strength emphasis, stretching PRN    PT Home Exercise Plan Access Code: [email or web address]   Consulted and Agree with Plan of Care Patient             Patient will benefit from skilled therapeutic intervention in order to improve the following deficits and impairments:  Decreased endurance, Decreased range of motion, Decreased strength, Increased edema, Impaired perceived  functional ability, Impaired UE functional use, Pain  Visit Diagnosis: Muscle weakness (generalized)  Stiffness of right shoulder, not elsewhere classified  Chronic right shoulder pain  Localized edema     Problem List Patient Active Problem List   Diagnosis Date Noted   Chronic right shoulder pain 01/25/2021   History of shoulder surgery 01/25/2021   Limited range of motion (ROM) of shoulder 01/25/2021   Osteopenia determined by x-ray 09/01/2020   Hyperlipidemia associated with type 2 diabetes mellitus ([place] 04/05/2020   Changes in vision 06/03/2019   Di[place], new onset ([place] 05/19/2019   Elevated ALT measurement 05/19/2019   Unexplained weight loss 05/19/2019   Abnormal appearance of cervix 05/19/2019   Language barrier 05/19/2019   HYPERCHOLESTEROLEMIA 03/15/2009   OVERWEIGHT 03/15/2009   BRADYCARDIA 03/15/2009   CHEST PAIN 03/15/2009    RFarley Ly PT, MPT 04/01/2021, 4:15 PM  [facility] [address] [address]Phone: [phone]  Fax:  [phone] Name: MShraddha LebronMRN: [id number] of Birth: 804/01/1961

## 2021-04-05 ENCOUNTER — Ambulatory Visit (INDEPENDENT_AMBULATORY_CARE_PROVIDER_SITE_OTHER): Payer: 59 | Admitting: Medical

## 2021-04-05 ENCOUNTER — Other Ambulatory Visit: Payer: Self-pay

## 2021-04-05 ENCOUNTER — Encounter: Payer: Self-pay | Admitting: Medical

## 2021-04-05 VITALS — BP 120/78 | HR 101 | Wt 175.8 lb

## 2021-04-05 DIAGNOSIS — Z789 Other specified health status: Secondary | ICD-10-CM

## 2021-04-05 DIAGNOSIS — E569 Vitamin deficiency, unspecified: Secondary | ICD-10-CM | POA: Diagnosis not present

## 2021-04-05 DIAGNOSIS — K0510 Chronic gingivitis, plaque induced: Secondary | ICD-10-CM | POA: Diagnosis not present

## 2021-04-05 DIAGNOSIS — K036 Deposits [accretions] on teeth: Secondary | ICD-10-CM | POA: Diagnosis not present

## 2021-04-05 DIAGNOSIS — E119 Type 2 diabetes mellitus without complications: Secondary | ICD-10-CM | POA: Insufficient documentation

## 2021-04-05 DIAGNOSIS — E1169 Type 2 diabetes mellitus with other specified complication: Secondary | ICD-10-CM

## 2021-04-05 HISTORY — DX: Vitamin deficiency, unspecified: E56.9

## 2021-04-05 HISTORY — DX: Chronic gingivitis, plaque induced: K05.10

## 2021-04-05 NOTE — Progress Notes (Addendum)
Subjective:  Traci Young is a 60 y.o. female who presents for Chief Complaint  Patient presents with   Jaw Pain    Went to dentist and their treatment did not work. Jaw pain has been hurting for 4 months     Here for consult.  We used #[id number] Interpreter on a Stick.  Here for follow-up on intraoral issues.  She was seeing Hetty Blend, NP here before Vickie left our practice  She has had problems with her gums being red, intraoral discomfort and blisters.  She has seen her dentist twice now in [place].  They advised to follow-up with primary care she reports that they did not have a specific treatment recommendation  These problems continue for months.  She does endorse flossing and brushing regularly.  She does eat some spicy foods but not real often.  She is compliant with her medications and blood sugars have been running less than 130 fasting.  She has no other symptoms.  No fever, no body aches, no chills, no belly pain, no other rash or sores.  Otherwise normal state of health.  No other aggravating or relieving factors.    No other c/o.  Past Medical History:  Diagnosis Date   Diabetes mellitus, new onset (HCC) 05/19/2019   Hgb A1c >14%   Elevated lipids    no meds, diet  controlled   Obesity    Osteopenia determined by x-ray 09/01/2020   SVD (spontaneous vaginal delivery)    x 4   Current Outpatient Medications on File Prior to Visit  Medication Sig Dispense Refill   atorvastatin (LIPITOR) 10 MG tablet Take 1 tablet (10 mg total) by mouth in the morning. 90 tablet 0   Cholecalciferol (VITAMIN D3 PO) Take 1 tablet by mouth 3 (three) times a week.     metFORMIN (GLUCOPHAGE) 1000 MG tablet Take 1 tablet (1,000 mg total) by mouth 2 (two) times daily with a meal. 180 tablet 0   traMADol (ULTRAM) 50 MG tablet Take 1 tablet (50 mg total) by mouth every 6 (six) hours as needed. 20 tablet 0   acetaminophen (TYLENOL) 500 MG tablet Take 1,000 mg by mouth every 6  (six) hours as needed (pain).     Glucose Blood (BLOOD GLUCOSE TEST STRIPS) STRP Test twice a day. Pt has one touch verio meter 100 strip 2   OneTouch Delica Lancets 30G MISC 1 each by Does not apply route 2 (two) times daily after a meal. 100 each 1   No current facility-administered medications on file prior to visit.     Family History  Problem Relation Age of Onset   Heart disease Mother 43   Heart disease Father 75   Diabetes Father    Colon cancer Neg Hx    Rectal cancer Neg Hx    Stomach cancer Neg Hx      The following portions of the patient's history were reviewed and updated as appropriate: allergies, current medications, past family history, past medical history, past social history, past surgical history and problem list.  ROS Otherwise as in subjective above    Objective: BP 120/78 (BP Location: Right Arm, Patient Position: Sitting)   Pulse (!) 101   Wt 175 lb 12.8 oz (79.7 kg)   LMP 06/30/2015   SpO2 98%   BMI 31.64 kg/m   General appearance: alert, no distress, well developed, well nourished HEENT: normocephalic, sclerae anicteric, conjunctiva pink and moist, nares patent, no discharge or erythema, pharynx normal  Oral cavity: MMM, there is some generalized pinkish-red coloration inflamed appearance of gums throughout scattered, there appears to be some gumline receding around some teeth, there is moderate plaque but no other obvious blister or lesion.  No lesions    Assessment: Encounter Diagnoses  Name Primary?   Type 2 diabetes mellitus with other specified complication, without long-term current use of insulin (HCC) Yes   Vitamin deficiency    Gum inflammation    Dental plaque    Language barrier      Plan: Discussed exam findings, symptoms and concerns.  We discussed possible causes.  Her blood sugars recently have been under control, most recent hemoglobin A1c in September at goal.  We will check some additional labs today for  evaluation.  We discussed that we may ultimately need to have her see oral surgeon for biopsy or other evaluation.   Traci Young was seen today for jaw pain.  Diagnoses and all orders for this visit:  Type 2 diabetes mellitus with other specified complication, without long-term current use of insulin (HCC) -     Sedimentation rate -     ANA -     Folate -     VITAMIN D 25 Hydroxy (Vit-D Deficiency, Fractures)  Vitamin deficiency -     Sedimentation rate -     ANA -     Folate -     VITAMIN D 25 Hydroxy (Vit-D Deficiency, Fractures)  Gum inflammation -     Sedimentation rate -     ANA -     Folate -     VITAMIN D 25 Hydroxy (Vit-D Deficiency, Fractures)  Dental plaque  Language barrier   Follow up: pending labs

## 2021-04-06 ENCOUNTER — Telehealth: Payer: Self-pay

## 2021-04-06 ENCOUNTER — Other Ambulatory Visit: Payer: Self-pay | Admitting: Medical

## 2021-04-06 LAB — SEDIMENTATION RATE: Sed Rate: 16 mm/hr (ref 0–40)

## 2021-04-06 LAB — VITAMIN D 25 HYDROXY (VIT D DEFICIENCY, FRACTURES): Vit D, 25-Hydroxy: 27 ng/mL — ABNORMAL LOW (ref 30.0–100.0)

## 2021-04-06 LAB — ANA: Anti Nuclear Antibody (ANA): NEGATIVE

## 2021-04-06 LAB — FOLATE: Folate: 16.8 ng/mL (ref >3.0)

## 2021-04-06 MED ORDER — VITAMIN D 25 MCG (1000 UNIT) PO TABS: 1000.0000 [IU] | ORAL_TABLET | Freq: Every day | ORAL | 3 refills | Status: DC

## 2021-04-06 NOTE — Telephone Encounter (Signed)
Faxed Dr. Debria Garret office for last 2 dentist office notes, attempted to call but office is closed for the Thanksgiving weekend

## 2021-04-11 ENCOUNTER — Other Ambulatory Visit: Payer: Self-pay | Admitting: Internal Medicine

## 2021-04-11 DIAGNOSIS — K036 Deposits [accretions] on teeth: Secondary | ICD-10-CM

## 2021-04-11 DIAGNOSIS — Z789 Other specified health status: Secondary | ICD-10-CM

## 2021-04-11 DIAGNOSIS — K0510 Chronic gingivitis, plaque induced: Secondary | ICD-10-CM

## 2021-04-21 ENCOUNTER — Encounter: Payer: 59 | Admitting: Physical Therapy

## 2021-05-30 ENCOUNTER — Other Ambulatory Visit: Payer: Self-pay

## 2021-05-30 ENCOUNTER — Ambulatory Visit (INDEPENDENT_AMBULATORY_CARE_PROVIDER_SITE_OTHER): Payer: 59 | Admitting: Family Medicine

## 2021-05-30 ENCOUNTER — Encounter: Payer: Self-pay | Admitting: Family Medicine

## 2021-05-30 VITALS — BP 128/76 | HR 95 | Temp 97.5°F | Wt 178.4 lb

## 2021-05-30 DIAGNOSIS — B001 Herpesviral vesicular dermatitis: Secondary | ICD-10-CM

## 2021-05-30 MED ORDER — VALACYCLOVIR HCL 1 G PO TABS: 1000.0000 mg | ORAL_TABLET | Freq: Two times a day (BID) | ORAL | 0 refills | Status: AC

## 2021-05-30 NOTE — Progress Notes (Signed)
° °  Subjective:    Patient ID: Traci Young, female    DOB: 01-25-1961, 61 y.o.   MRN: [id number]  HPI She is here for evaluation of a lesion on her mid lip.  She is Spanish and language is somewhat of a barrier.  Apparently she has not had this before nor has she had that in that same spot.   Review of Systems     Objective:   Physical Exam Exam of the lower mid lip area does show an ulcerated flat lesion.       Assessment & Plan:  Herpes labialis - Plan: valACYclovir (VALTREX) 1000 MG tablet Information was given to her in Spanish concerning the cold sore.  She will call if she has questions.

## 2021-05-30 NOTE — Patient Instructions (Signed)
Herpes labial [place]ore El herpes labial, tambin llamado boquera, es una pequea ampolla llena de lquido que se forma dentro de la boca o en los labios, las encas, la nariz, el mentn o las mejillas. El herpes labial puede diseminarse a otras partes del cuerpo, como los ojos o los dedos. En algunas personas que tienen otras afecciones, el herpes labial puede diseminarse a muchos otros lugares del cuerpo, incluidos los genitales. Se puede transmitir de una persona a otra (es contagioso) hasta que las ampollas se cicatrizan por completo. La mayora de los herpes labiales desaparecen en el trmino de 2 semanas. Cules son las causas? Los herpes labiales son causados por la infeccin con un tipo comn del virus del herpes simple (VHS-1). El VHS 1 est relacionado estrechamente con el VHS-2,el cual es el virus que causa el herpes genital, pero estos virus no son iguales. Una vez que una persona se infecta con el VHS-1, el virus permanece para siempre en el organismo. El VHS-1 se contagia de una persona a otra a travs del contacto directo, como al besarse, tocar la zona afectada, o compartir elementos personales como protector labial, afeitadoras, vasos o cubiertos. Qu incrementa el riesgo? Es ms probable que tenga esta afeccin si: Est cansado, estresado o enfermo. Est menstruando. Est embarazada. Toma ciertos medicamentos. Se expone al clima fro o al sol en exceso. Cules son los signos o los sntomas? Los sntomas de un brote de herpes labial pasan por diferentes etapas. Estas son las etapas del herpes labial: Uno o dos das antes del brote, podr sentir pinchazos, picazn o ardor. Aparecen ampollas llenas de lquido en los labios, dentro de la boca, en la nariz o en las mejillas. Las ampollas comienzan a supurar un lquido claro. Las ampollas se secan y aparece una costra amarillenta en su lugar. La costra se cae. En algunos casos, pueden presentarse otros sntomas durante un brote de  herpes labial. Estos pueden incluir los siguientes: Fiebre. Dolor de garganta. Dolor de cabeza. Dolores musculares. Ganglios del cuello inflamados. Cmo se diagnostica? Esta afeccin se diagnostica en funcin de sus antecedentes mdicos y de un examen fsico. El mdico puede hacerle un anlisis de sangre o tomar lquido de la ampolla con un hisopo para luego examinarlo en el laboratorio. Cmo se trata? No existe cura para el herpes labial o VHS-1. Tampoco hay vacuna para el VHS-1. La mayora de los herpes labiales desaparecen sin tratamiento en el trmino de 2 semanas. Aunque no hay medicamentos que puedan eliminar la infeccin, el mdico puede recetarle medicamentos para: Ayudar a aliviar algo del dolor asociado a las ampollas. Impedir que el virus se multiplique. Acortar el tiempo de cicatrizacin. Los medicamentos pueden presentarse en forma de crema, gel, pldoras o inyeccin. Siga estas indicaciones en su casa: Medicamentos Tome o aplquese los medicamentos de venta libre y los recetados solamente como se lo haya indicado el mdico. Use un hisopo de algodn para aplicar crema o gel en las ampollas. Pregntele al mdico si puede tomar suplementos de lisina. Se ha descubierto en investigaciones que la lisina puede ayudar a que los herpes labiales cicatricen ms rpido y a prevenir brotes. Cuidado de las ampollas   No se toque las ampollas ni retire las costras. Lvese las manos con frecuencia. No se toque los ojos sin antes lavarse las manos. Mantenga las ampollas limpias y secas. Si se lo indican, aplique hielo sobre las ampollas: Ponga el hielo en una bolsa plstica. Coloque una toalla entre la piel y   la bolsa. Coloque el hielo durante 20 minutos, 2 a 3 veces por da. Comida y bebida Consuma una dieta liviana y blanda. Evite consumir alimentos calientes, fros o salados. Use un sorbete si beber lquidos de un vaso le causa dolor. Coma alimentos ricos en lisina, como carne  vacuna, pescado y lcteos. Evite los alimentos azucarados, los chocolates, los frutos secos y los cereales. Estos alimentos son ricos en un nutriente que se llama arginina, el cual puede hacer que el virus se multiplique. Estilo de vida No bese, no mantenga sexo oral ni comparta artculos de uso personal hasta que las ampollas se cicatricen. El estrs, el no dormir lo suficiente y el estar afuera al sol pueden desencadenar brotes. Asegrese de hacer lo siguiente: Realice actividades que lo ayuden a relajarse, como ejercicios de respiracin profunda o meditacin. Duerma lo suficiente. Aplquese pantalla solar en los labios antes de salir al sol. Comunquese con un mdico si: Tiene sntomas durante ms de 2 semanas. Le sale pus de las ampollas. El enrojecimiento se expande. Siente dolor o irritacin en el ojo. Tiene ampollas en los genitales. Las ampollas no se curan en el trmino de 2 semanas. Tiene brotes frecuentes de herpes labiales. Solicite ayuda inmediatamente si tiene: Fiebre y los sntomas empeoran repentinamente. Dolor de cabeza y confusin. Fatiga o prdida del apetito. Rigidez en el cuello o sensibilidad a la luz. Resumen El herpes labial, tambin llamado boquera, es una pequea ampolla llena de lquido que se forma dentro de la boca o en los labios, las encas, la nariz, el mentn o las mejillas. La mayora de los herpes labiales desaparecen sin tratamiento en el trmino de 2 semanas. El mdico puede recetarle medicamentos para ayudar a aliviar un poco el dolor, impedir que el virus se multiplique y acortar el tiempo de cicatrizacin. Lvese las manos con frecuencia. No se toque los ojos sin antes lavarse las manos. No bese, no mantenga sexo oral ni comparta artculos de uso personal hasta que las ampollas se cicatricen. Comunquese con un mdico si las ampollas no cicatrizan en el trmino de 2 semanas. Esta informacin no tiene como fin reemplazar el consejo del mdico.  Asegrese de hacerle al mdico cualquier pregunta que tenga. Document Revised: 11/07/2017 Document Reviewed: 11/07/2017 Elsevier Patient Education  2022 Elsevier Inc.  

## 2021-06-10 ENCOUNTER — Telehealth: Payer: Self-pay | Admitting: Physician Assistant

## 2021-06-10 ENCOUNTER — Other Ambulatory Visit: Payer: Self-pay

## 2021-06-10 DIAGNOSIS — E119 Type 2 diabetes mellitus without complications: Secondary | ICD-10-CM

## 2021-06-10 MED ORDER — METFORMIN HCL 1000 MG PO TABS
1000.0000 mg | ORAL_TABLET | Freq: Two times a day (BID) | ORAL | 0 refills | Status: DC
Start: 2021-06-10 — End: 2021-07-25

## 2021-06-10 MED ORDER — ATORVASTATIN CALCIUM 10 MG PO TABS: 10.0000 mg | ORAL_TABLET | Freq: Every morning | ORAL | 0 refills | Status: DC

## 2021-06-10 NOTE — Telephone Encounter (Signed)
Pt left message requesting atorvastatin and metformin.

## 2021-06-10 NOTE — Telephone Encounter (Signed)
Done KH 

## 2021-07-07 ENCOUNTER — Other Ambulatory Visit: Payer: Self-pay | Admitting: Family Medicine

## 2021-07-07 DIAGNOSIS — E119 Type 2 diabetes mellitus without complications: Secondary | ICD-10-CM

## 2021-07-22 NOTE — Progress Notes (Signed)
? ?Acute Office Visit ? ?Subjective:  ? ? Patient ID: Traci Young, female    DOB: 04-04-1961, 61 y.o.   MRN: [id number] ? ?Chief Complaint  ?Patient presents with  ? Medication Refill  ?  Med check. No other concerns  ? ? ?HPI ?Patient is in today for a follow up appointment. Denies any new issues. Speaks and understands most [place]. ? ?Ophthalmology - Hyman Hopes, MD last exam 03/01/2021 ?Last hgb a1c on 01/26/2021, 6.4 ? ?Past Medical History:  ?Diagnosis Date  ? Diabetes mellitus, new onset ([place]) 05/19/2019  ? Hgb A1c >14%  ? Elevated lipids   ? no meds, diet  controlled  ? HYPERCHOLESTEROLEMIA 03/15/2009  ? Qualifier: Diagnosis of  By: Caryl Comes, MD, Remus Blake   ? Obesity   ? Osteopenia determined by x-ray 09/01/2020  ? SVD (spontaneous vaginal delivery)   ? x 4  ? ? ?Past Surgical History:  ?Procedure Laterality Date  ? CHOLECYSTECTOMY N/A 03/04/2021  ? Procedure: LAPAROSCOPIC CHOLECYSTECTOMY;  Surgeon: Ralene Ok, MD;  Location: [facility];  Service: General;  Laterality: N/A;  ? ENDOVENOUS ABLATION SAPHENOUS VEIN W/ LASER Right 04/24/2018  ? endovenous laser ablation right greater saphenous vein and stab phlebectomy > 20 incisions right leg by Ruta Hinds MD   ? SHOULDER SURGERY Right   ? TUBAL LIGATION    ? ? ?Family History  ?Problem Relation Age of Onset  ? Heart disease Mother 68  ? Heart disease Father 35  ? Diabetes Father   ? Colon cancer Neg Hx   ? Rectal cancer Neg Hx   ? Stomach cancer Neg Hx   ? ? ?Social History  ? ?Socioeconomic History  ? Marital status: Married  ?  Spouse name: Not on file  ? Number of children: Not on file  ? Years of education: Not on file  ? Highest education level: Not on file  ?Occupational History  ? Not on file  ?Tobacco Use  ? Smoking status: Former  ?  Packs/day: 0.25  ?  Years: 6.00  ?  Pack years: 1.50  ?  Types: Cigarettes  ? Smokeless tobacco: Never  ? Tobacco comments:  ?  Patient quit smoking 23 years ago  ?Vaping Use  ? Vaping  Use: Never used  ?Substance and Sexual Activity  ? Alcohol use: No  ? Drug use: No  ? Sexual activity: Yes  ?  Birth control/protection: Surgical, Post-menopausal  ?  Comment: Tubal Ligation  ?Other Topics Concern  ? Not on file  ?Social History Narrative  ? Not on file  ? ?Social Determinants of Health  ? ?Financial Resource Strain: Not on file  ?Food Insecurity: Not on file  ?Transportation Needs: Not on file  ?Physical Activity: Not on file  ?Stress: Not on file  ?Social Connections: Not on file  ?Intimate Partner Violence: Not on file  ? ? ?Outpatient Medications Prior to Visit  ?Medication Sig Dispense Refill  ? acetaminophen (TYLENOL) 500 MG tablet Take 1,000 mg by mouth every 6 (six) hours as needed (pain).    ? atorvastatin (LIPITOR) 10 MG tablet Take 1 tablet (10 mg total) by mouth in the morning. 90 tablet 0  ? cholecalciferol (VITAMIN D3) 25 MCG (1000 UNIT) tablet Take 1 tablet (1,000 Units total) by mouth daily. 90 tablet 3  ? clobetasol ointment (TEMOVATE) 0.05 % Apply topically 2 (two) times daily.    ? Glucose Blood (BLOOD GLUCOSE TEST STRIPS) STRP Test twice a day.  Pt has one touch verio meter 100 strip 2  ? metFORMIN (GLUCOPHAGE) 1000 MG tablet Take 1 tablet (1,000 mg total) by mouth 2 (two) times daily with a meal. 180 tablet 0  ? OneTouch Delica Lancets 85O MISC 1 each by Does not apply route 2 (two) times daily after a meal. 100 each 1  ? acyclovir ointment (ZOVIRAX) 5 % Apply topically. (Patient not taking: Reported on 07/25/2021)    ? dexamethasone 0.5 MG/5ML elixir Take 0.5 mg by mouth 4 (four) times daily. (Patient not taking: Reported on 07/25/2021)    ? traMADol (ULTRAM) 50 MG tablet Take 1 tablet (50 mg total) by mouth every 6 (six) hours as needed. (Patient not taking: Reported on 05/30/2021) 20 tablet 0  ? ?No facility-administered medications prior to visit.  ? ? ?No Known Allergies ? ?Review of Systems  ?Constitutional:  Negative for activity change and chills.  ?HENT:  Negative for  congestion and voice change.   ?Eyes:  Negative for pain and redness.  ?Respiratory:  Negative for cough and wheezing.   ?Cardiovascular:  Negative for chest pain.  ?Gastrointestinal:  Negative for constipation, diarrhea, nausea and vomiting.  ?Endocrine: Negative for polyuria.  ?Genitourinary:  Negative for frequency.  ?Skin:  Negative for color change and rash.  ?Allergic/Immunologic: Negative for immunocompromised state.  ?Neurological:  Negative for dizziness.  ?Psychiatric/Behavioral:  Negative for agitation.   ? ?   ?Objective:  ?  ?Physical Exam ?Vitals and nursing note reviewed.  ?Constitutional:   ?   General: She is not in acute distress. ?   Appearance: Normal appearance. She is not ill-appearing.  ?HENT:  ?   Head: Normocephalic and atraumatic.  ?   Right Ear: External ear normal.  ?   Left Ear: External ear normal.  ?   Nose: No congestion.  ?Eyes:  ?   Extraocular Movements: Extraocular movements intact.  ?   Conjunctiva/sclera: Conjunctivae normal.  ?   Pupils: Pupils are equal, round, and reactive to light.  ?Cardiovascular:  ?   Rate and Rhythm: Normal rate and regular rhythm.  ?   Pulses: Normal pulses.  ?   Heart sounds: Normal heart sounds.  ?Pulmonary:  ?   Effort: Pulmonary effort is normal.  ?   Breath sounds: Normal breath sounds. No wheezing.  ?Abdominal:  ?   General: Bowel sounds are normal.  ?   Palpations: Abdomen is soft.  ?Musculoskeletal:  ?   Cervical back: Normal range of motion and neck supple.  ?   Right lower leg: No edema.  ?   Left lower leg: No edema.  ?Skin: ?   General: Skin is warm and dry.  ?   Findings: No bruising.  ?Neurological:  ?   General: No focal deficit present.  ?   Mental Status: She is alert and oriented to person, place, and time.  ?Psychiatric:     ?   Mood and Affect: Mood normal.     ?   Behavior: Behavior normal.     ?   Thought Content: Thought content normal.  ? ? ?BP 120/70   Pulse 77   Wt 175 lb (79.4 kg)   LMP 06/30/2015   SpO2 98%   BMI 31.50  kg/m?  ? ?Wt Readings from Last 3 Encounters:  ?07/25/21 175 lb (79.4 kg)  ?05/30/21 178 lb 6.4 oz (80.9 kg)  ?04/05/21 175 lb 12.8 oz (79.7 kg)  ? ? ?Health Maintenance Due  ?Topic Date Due  ?  OPHTHALMOLOGY EXAM  Never done  ? Zoster Vaccines- Shingrix (1 of 2) Never done  ? COVID-19 Vaccine (3 - Booster for Janssen series) 05/31/2020  ? HEMOGLOBIN A1C  07/25/2021  ? URINE MICROALBUMIN  08/02/2021  ? ? ?There are no preventive care reminders to display for this patient. ? ? ?Lab Results  ?Component Value Date  ? TSH 1.810 01/25/2021  ? ?Lab Results  ?Component Value Date  ? WBC 6.2 03/04/2021  ? HGB 15.0 03/04/2021  ? HCT 44.0 03/04/2021  ? MCV 86.2 03/04/2021  ? PLT 281 03/04/2021  ? ?Lab Results  ?Component Value Date  ? NA 140 03/04/2021  ? K 4.1 03/04/2021  ? CO2 23 03/04/2021  ? GLUCOSE 144 (H) 03/04/2021  ? BUN 16 03/04/2021  ? CREATININE 0.50 03/04/2021  ? BILITOT 0.2 01/25/2021  ? ALKPHOS 77 01/25/2021  ? AST 21 01/25/2021  ? ALT 25 01/25/2021  ? PROT 7.3 01/25/2021  ? ALBUMIN 4.8 01/25/2021  ? CALCIUM 9.4 03/04/2021  ? ANIONGAP 10 03/04/2021  ? EGFR 106 01/25/2021  ? ?Lab Results  ?Component Value Date  ? CHOL 160 08/02/2020  ? ?Lab Results  ?Component Value Date  ? HDL 49 08/02/2020  ? ?Lab Results  ?Component Value Date  ? [place] 84 08/02/2020  ? ?Lab Results  ?Component Value Date  ? TRIG 156 (H) 08/02/2020  ? ?Lab Results  ?Component Value Date  ? CHOLHDL 3.3 08/02/2020  ? ?Lab Results  ?Component Value Date  ? HGBA1C 6.4 (H) 01/25/2021  ? ? ?   ?Assessment & Plan:  ? ?Problem List Items Addressed This Visit   ? ?  ? Endocrine  ? Hyperlipidemia associated with type 2 diabetes mellitus ([place])  ? Diabetes mellitus ([place])  ?  ? Other  ? Mixed hyperlipidemia - Primary  ? ? ? ?No orders of the defined types were placed in this encounter. ? ?Return in 3 months for annual exam. ? ?Irene Pap, PA-C ? ?

## 2021-07-25 ENCOUNTER — Ambulatory Visit (INDEPENDENT_AMBULATORY_CARE_PROVIDER_SITE_OTHER): Payer: 59 | Admitting: Physician Assistant

## 2021-07-25 ENCOUNTER — Encounter: Payer: Self-pay | Admitting: Physician Assistant

## 2021-07-25 VITALS — BP 120/70 | HR 77 | Ht 62.5 in | Wt 175.0 lb

## 2021-07-25 DIAGNOSIS — E559 Vitamin D deficiency, unspecified: Secondary | ICD-10-CM

## 2021-07-25 DIAGNOSIS — Z23 Encounter for immunization: Secondary | ICD-10-CM

## 2021-07-25 DIAGNOSIS — E782 Mixed hyperlipidemia: Secondary | ICD-10-CM | POA: Insufficient documentation

## 2021-07-25 DIAGNOSIS — E785 Hyperlipidemia, unspecified: Secondary | ICD-10-CM

## 2021-07-25 DIAGNOSIS — Z6831 Body mass index (BMI) 31.0-31.9, adult: Secondary | ICD-10-CM

## 2021-07-25 DIAGNOSIS — E1169 Type 2 diabetes mellitus with other specified complication: Secondary | ICD-10-CM

## 2021-07-25 MED ORDER — ATORVASTATIN CALCIUM 10 MG PO TABS: 10.0000 mg | ORAL_TABLET | Freq: Every morning | ORAL | 2 refills | Status: DC

## 2021-07-25 MED ORDER — METFORMIN HCL 1000 MG PO TABS: 1000.0000 mg | ORAL_TABLET | Freq: Two times a day (BID) | ORAL | 2 refills | Status: DC

## 2021-10-31 NOTE — Progress Notes (Unsigned)
Complete physical exam   Patient: Traci Young   DOB: [birth year]   61 y.o. Female  MRN: [id number] Visit Date: 11/01/2021  No chief complaint on file.  Subjective    Traci Young is a 61 y.o. female who presents today for a complete physical exam.   Reports is generally feeling well, fairly well, poorly*; is eating a *** diet; is sleeping well ***; drinks *** bottles of water a day; is exercising ***   HPI  ***  Past Medical History:  Diagnosis Date   CHEST PAIN 03/15/2009   Qualifier: Diagnosis of  By: Graciela Husbands, MD, Susie Cassette    Diabetes mellitus, new onset ([facility]) 05/19/2019   Hgb A1c >14%   Elevated lipids    no meds, diet  controlled   Gum inflammation 04/05/2021   HYPERCHOLESTEROLEMIA 03/15/2009   Qualifier: Diagnosis of  By: Graciela Husbands, MD, Susie Cassette    Obesity    Osteopenia determined by x-ray 09/01/2020   OVERWEIGHT 03/15/2009   Qualifier: Diagnosis of  By: Graciela Husbands, MD, Susie Cassette    SVD (spontaneous vaginal delivery)    x 4   Vitamin deficiency 04/05/2021   Past Surgical History:  Procedure Laterality Date   CHOLECYSTECTOMY N/A 03/04/2021   Procedure: LAPAROSCOPIC CHOLECYSTECTOMY;  Surgeon: Axel Filler, MD;  Location: [facility];  Service: General;  Laterality: N/A;   ENDOVENOUS ABLATION SAPHENOUS VEIN W/ LASER Right 04/24/2018   endovenous laser ablation right greater saphenous vein and stab phlebectomy > 20 incisions right leg by Fabienne Bruns MD    SHOULDER SURGERY Right    TUBAL LIGATION     Social History   Socioeconomic History   Marital status: Married    Spouse name: Not on file   Number of children: Not on file   Years of education: Not on file   Highest education level: Not on file  Occupational History   Not on file  Tobacco Use   Smoking status: Former    Packs/day: 0.25    Years: 6.00    Total pack years: 1.50    Types: Cigarettes   Smokeless tobacco: Never   Tobacco comments:     Patient quit smoking 23 years ago  Vaping Use   Vaping Use: Never used  Substance and Sexual Activity   Alcohol use: No   Drug use: No   Sexual activity: Yes    Birth control/protection: Surgical, Post-menopausal    Comment: Tubal Ligation  Other Topics Concern   Not on file  Social History Narrative   Not on file   Social Determinants of Health   Financial Resource Strain: Not on file  Food Insecurity: Not on file  Transportation Needs: Not on file  Physical Activity: Not on file  Stress: Not on file  Social Connections: Not on file  Intimate Partner Violence: Not on file   Family Status  Relation Name Status   Mother  Deceased   Father  Deceased   Brother  Deceased   Neg Hx  (Not Specified)   Family History  Problem Relation Age of Onset   Heart disease Mother 46   Heart disease Father 68   Diabetes Father    Colon cancer Neg Hx    Rectal cancer Neg Hx    Stomach cancer Neg Hx    No Known Allergies  Patient Care Team: Lexine Baton as PCP - General (Physician Assistant)   Medications: Outpatient Medications Prior to Visit  Medication Sig   acetaminophen (TYLENOL) 500 MG tablet Take 1,000 mg by mouth every 6 (six) hours as needed (pain).   acyclovir ointment (ZOVIRAX) 5 % Apply topically. (Patient not taking: Reported on 07/25/2021)   atorvastatin (LIPITOR) 10 MG tablet Take 1 tablet (10 mg total) by mouth in the morning.   cholecalciferol (VITAMIN D3) 25 MCG (1000 UNIT) tablet Take 1 tablet (1,000 Units total) by mouth daily.   clobetasol ointment (TEMOVATE) 0.05 % Apply topically 2 (two) times daily.   dexamethasone 0.5 MG/5ML elixir Take 0.5 mg by mouth 4 (four) times daily. (Patient not taking: Reported on 07/25/2021)   Glucose Blood (BLOOD GLUCOSE TEST STRIPS) STRP Test twice a day. Pt has one touch verio meter   metFORMIN (GLUCOPHAGE) 1000 MG tablet Take 1 tablet (1,000 mg total) by mouth 2 (two) times daily with a meal.   OneTouch Delica Lancets  30G MISC 1 each by Does not apply route 2 (two) times daily after a meal.   No facility-administered medications prior to visit.    Review of Systems  {Labs (Optional):23779}  The 10-year ASCVD risk score (Arnett DK, et al., 2019) is: 4.5%   Objective    LMP 06/30/2015   {Show previous vital signs (optional):23777}   Physical Exam  ***  Last depression screening scores    07/25/2021    3:10 PM 04/05/2021    3:22 PM 08/02/2020    2:37 PM  PHQ 2/9 Scores  PHQ - 2 Score 0 0 0   Last fall risk screening    07/25/2021    3:10 PM  Fall Risk   Falls in the past year? 0  Number falls in past yr: 0  Injury with Fall? 0  Risk for fall due to : No Fall Risks  Follow up Falls evaluation completed     No results found for any visits on 11/01/21.  Assessment & Plan    Routine Health Maintenance and Physical Exam  Exercise Activities and Dietary recommendations  Goals   None     Immunization History  Administered Date(s) Administered   Influenza Split 03/14/2011   Influenza Whole 02/16/2009, 04/28/2010   Influenza,inj,Quad PF,6+ Mos 08/13/2017, 05/21/2019, 04/05/2020, 01/25/2021   Influenza-Unspecified 04/11/2016   Janssen (J&J) SARS-COV-2 Vaccination 08/25/2019   Moderna Sars-Covid-2 Vaccination 04/05/2020   Pfizer Covid-19 Vaccine Bivalent Booster 50yrs & up 07/25/2021   Pneumococcal Polysaccharide-23 05/21/2019   Tdap 02/16/2009, 08/13/2017    Health Maintenance  Topic Date Due   OPHTHALMOLOGY EXAM  Never done   Zoster Vaccines- Shingrix (1 of 2) Never done   HEMOGLOBIN A1C  07/25/2021   FOOT EXAM  08/02/2021   URINE MICROALBUMIN  08/02/2021   INFLUENZA VACCINE  12/13/2021   PAP SMEAR-Modifier  05/18/2022   MAMMOGRAM  09/01/2022   TETANUS/TDAP  08/14/2027   COLONOSCOPY (Pts 45-40yrs Insurance coverage will need to be confirmed)  10/03/2027   COVID-19 Vaccine  Completed   Hepatitis C Screening  Completed   HIV Screening  Completed   HPV VACCINES  Aged  Out    Discussed health benefits of physical activity, and encouraged her to engage in regular exercise appropriate for her age and condition.  Problem List Items Addressed This Visit   None    No follow-ups on file.     Jake Shark, PA-C

## 2021-11-01 ENCOUNTER — Ambulatory Visit (INDEPENDENT_AMBULATORY_CARE_PROVIDER_SITE_OTHER): Payer: 59 | Admitting: Physician Assistant

## 2021-11-01 ENCOUNTER — Encounter: Payer: Self-pay | Admitting: Physician Assistant

## 2021-11-01 VITALS — BP 130/80 | HR 74 | Ht 62.5 in | Wt 170.0 lb

## 2021-11-01 DIAGNOSIS — E785 Hyperlipidemia, unspecified: Secondary | ICD-10-CM

## 2021-11-01 DIAGNOSIS — E1165 Type 2 diabetes mellitus with hyperglycemia: Secondary | ICD-10-CM

## 2021-11-01 DIAGNOSIS — Z23 Encounter for immunization: Secondary | ICD-10-CM

## 2021-11-01 DIAGNOSIS — E782 Mixed hyperlipidemia: Secondary | ICD-10-CM

## 2021-11-01 DIAGNOSIS — E1169 Type 2 diabetes mellitus with other specified complication: Secondary | ICD-10-CM | POA: Diagnosis not present

## 2021-11-01 DIAGNOSIS — Z0000 Encounter for general adult medical examination without abnormal findings: Secondary | ICD-10-CM

## 2021-11-01 LAB — POCT URINALYSIS DIP (PROADVANTAGE DEVICE)
Bilirubin, UA: NEGATIVE
Blood, UA: NEGATIVE
Glucose, UA: NEGATIVE mg/dL
Ketones, POC UA: NEGATIVE mg/dL
Leukocytes, UA: NEGATIVE
Nitrite, UA: NEGATIVE
Protein Ur, POC: NEGATIVE mg/dL
Specific Gravity, Urine: 1.01
Urobilinogen, Ur: 0.2
pH, UA: 6 (ref 5.0–8.0)

## 2021-11-01 MED ORDER — ATORVASTATIN CALCIUM 10 MG PO TABS
10.0000 mg | ORAL_TABLET | Freq: Every morning | ORAL | 2 refills | Status: DC
Start: 2021-11-01 — End: 2022-03-29

## 2021-11-01 MED ORDER — METFORMIN HCL 1000 MG PO TABS: 1000.0000 mg | ORAL_TABLET | Freq: Two times a day (BID) | ORAL | 2 refills | Status: DC

## 2021-11-01 NOTE — Patient Instructions (Signed)
.Preventative Care for Adults - Female      MAINTAIN REGULAR HEALTH EXAMS: A routine yearly physical is a good way to check in with your primary care provider about your health and preventive screening. It is also an opportunity to share updates about your health and any concerns you have, and receive a thorough all-over exam.  Most health insurance companies pay for at least some preventative services.  Check with your health plan for specific coverages.  WHAT PREVENTATIVE SERVICES DO WOMEN NEED? Adult women should have their weight and blood pressure checked regularly.  Women age 35 and older should have their cholesterol levels checked regularly. Women should be screened for cervical cancer with a Pap smear and pelvic exam beginning at either age 21, or 3 years after they become sexually activity.   Breast cancer screening generally begins at age 40 with a mammogram and breast exam by your primary care provider.   Beginning at age 45 and continuing to age 75, women should be screened for colorectal cancer.  Certain people may need continued testing until age 85. Updating vaccinations is part of preventative care.  Vaccinations help protect against diseases such as the flu. Osteoporosis is a disease in which the bones lose minerals and strength as we age. Women ages 65 and over should discuss this with their caregivers, as should women after menopause who have other risk factors. Lab tests are generally done as part of preventative care to screen for anemia and blood disorders, to screen for problems with the kidneys and liver, to screen for bladder problems, to check blood sugar, and to check your cholesterol level. Preventative services generally include counseling about diet, exercise, avoiding tobacco, drugs, excessive alcohol consumption, and sexually transmitted infections.    GENERAL RECOMMENDATIONS FOR GOOD HEALTH:  Healthy diet: Eat a variety of foods, including fruit, vegetables,  animal or vegetable protein, such as meat, fish, chicken, and eggs, or beans, lentils, tofu, and grains, such as rice. Drink plenty of water daily (60 - 80 ounces or 8 - 10 glasses a day) Decrease saturated fat in the diet, avoid lots of red meat, processed foods, sweets, fast foods, and fried foods. For high cholesterol - Increase fiber intake (Benefiber or Metamucil, Cherrios,  oatmeal, beans, nuts, fruits and vegetables), limit saturated fats (in fried foods, red meat), can add OTC fish oil supplement, eat fish with Omega-3 fatty acids like salmon and tuna, exercise for 30 minutes 3 - 5 times a week, drink 8 - 10 glasses of water a day  Exercise: Aerobic exercise helps maintain good heart health. At least 30-40 minutes of moderate-intensity exercise is recommended. For example, a brisk walk that increases your heart rate and breathing. This should be done on most days of the week.  Find a type of exercise or a variety of exercises that you enjoy so that it becomes a part of your daily life.  Examples are running, walking, swimming, water aerobics, and biking.  For motivation and support, explore group exercise such as aerobic class, spin class, Zumba, Yoga,or  martial arts, etc.   Set exercise goals for yourself, such as a certain weight goal, walk or run in a race such as a 5k walk/run.  Speak to your primary care provider about exercise goals.  Disease prevention: If you smoke or chew tobacco, find out from your caregiver how to quit. It can literally save your life, no matter how long you have been a tobacco user. If you do not   use tobacco, never begin.  Maintain a healthy diet and normal weight. Increased weight leads to problems with blood pressure and diabetes.  The Body Mass Index or BMI is a way of measuring how much of your body is fat. Having a BMI above 27 increases the risk of heart disease, diabetes, hypertension, stroke and other problems related to obesity. Your caregiver can help  determine your BMI and based on it develop an exercise and dietary program to help you achieve or maintain this important measurement at a healthful level. High blood pressure causes heart and blood vessel problems.  Persistent high blood pressure should be treated with medicine if weight loss and exercise do not work.  Fat and cholesterol leaves deposits in your arteries that can block them. This causes heart disease and vessel disease elsewhere in your body.  If your cholesterol is found to be high, or if you have heart disease or certain other medical conditions, then you may need to have your cholesterol monitored frequently and be treated with medication.  Ask if you should have a cardiac stress test if your history suggests this. A stress test is a test done on a treadmill that looks for heart disease. This test can find disease prior to there being a problem. Menopause can be associated with physical symptoms and risks. Hormone replacement therapy is available to decrease these. You should talk to your caregiver about whether starting or continuing to take hormones is right for you.  Osteoporosis is a disease in which the bones lose minerals and strength as we age. This can result in serious bone fractures. Risk of osteoporosis can be identified using a bone density scan. Women ages 65 and over should discuss this with their caregivers, as should women after menopause who have other risk factors. Ask your caregiver whether you should be taking a calcium supplement and Vitamin D, to reduce the rate of osteoporosis.  Avoid drinking alcohol in excess (more than two drinks per day).  Avoid use of street drugs. Do not share needles with anyone. Ask for professional help if you need assistance or instructions on stopping the use of alcohol, cigarettes, and/or drugs. Brush your teeth twice a day with fluoride toothpaste, and floss once a day. Good oral hygiene prevents tooth decay and gum disease. The  problems can be painful, unattractive, and can cause other health problems. Visit your dentist for a routine oral and dental check up and preventive care every 6-12 months.  Look at your skin regularly.  Use a mirror to look at your back. Notify your caregivers of changes in moles, especially if there are changes in shapes, colors, a size larger than a pencil eraser, an irregular border, or development of new moles.  Safety: Use seatbelts 100% of the time, whether driving or as a passenger.  Use safety devices such as hearing protection if you work in environments with loud noise or significant background noise.  Use safety glasses when doing any work that could send debris in to the eyes.  Use a helmet if you ride a bike or motorcycle.  Use appropriate safety gear for contact sports.  Talk to your caregiver about gun safety. Use sunscreen with a SPF (or skin protection factor) of 15 or greater.  Lighter skinned people are at a greater risk of skin cancer. Don't forget to also wear sunglasses in order to protect your eyes from too much damaging sunlight. Damaging sunlight can accelerate cataract formation.  Practice safe sex. Use   condoms. Condoms are used for birth control and to help reduce the spread of sexually transmitted infections (or STIs).  Some of the STIs are gonorrhea (the clap), chlamydia, syphilis, trichomonas, herpes, HPV (human papilloma virus) and HIV (human immunodeficiency virus) which causes AIDS. The herpes, HIV and HPV are viral illnesses that have no cure. These can result in disability, cancer and death.  Keep carbon monoxide and smoke detectors in your home functioning at all times. Change the batteries every 6 months or use a model that plugs into the wall.   Vaccinations: Stay up to date with your tetanus shots and other required immunizations. You should have a booster for tetanus every 10 years. Be sure to get your flu shot every year, since 5%-20% of the [place] population comes  down with the flu. The flu vaccine changes each year, so being vaccinated once is not enough. Get your shot in the fall, before the flu season peaks.   Other vaccines to consider: Human Papilloma Virus or HPV causes cancer of the cervix, and other infections that can be transmitted from person to person. There is a vaccine for HPV, and females should get immunized between the ages of 11 and 26. It requires a series of 3 shots.  Pneumococcal vaccine to protect against certain types of pneumonia.  This is normally recommended for adults age 65 or older.  However, adults younger than 60 years old with certain underlying conditions such as diabetes, heart or lung disease should also receive the vaccine. Shingles vaccine to protect against Varicella Zoster if you are older than age 60, or younger than 60 years old with certain underlying illness. Hepatitis A vaccine to protect against a form of infection of the liver by a virus acquired from food. Hepatitis B vaccine to protect against a form of infection of the liver by a virus acquired from blood or body fluids, particularly if you work in health care. If you plan to travel internationally, check with your local health department for specific vaccination recommendations.  Cancer Screening: Breast cancer screening is essential to preventive care for women. All women age 20 and older should perform a breast self-exam every month. At age 40 and older, women should have their caregiver complete a breast exam each year. Women at ages 40 and older should have a mammogram (x-ray film) of the breasts. Your caregiver can discuss how often you need mammograms.   Cervical cancer screening includes taking a Pap smear (sample of cells examined under a microscope) from the cervix (end of the uterus). It also includes testing for HPV (Human Papilloma Virus, which can cause cervical cancer). Screening and a pelvic exam should begin at age 21, or 3 years after a woman  becomes sexually active. Screening should occur every year, with a Pap smear but no HPV testing, up to age 30. After age 30, you should have a Pap smear every 3 years with HPV testing, if no HPV was found previously.  Most routine colon cancer screening begins at the age of 50. On a yearly basis, doctors may provide special easy to use take-home tests to check for hidden blood in the stool. Sigmoidoscopy or colonoscopy can detect the earliest forms of colon cancer and is life saving. These tests use a small camera at the end of a tube to directly examine the colon. Speak to your caregiver about this at age 50, when routine screening begins (and is repeated every 5 years unless early forms of pre-cancerous polyps   or small growths are found).    

## 2021-11-02 LAB — LIPID PANEL
Chol/HDL Ratio: 3.5 ratio (ref 0.0–4.4)
Cholesterol, Total: 186 mg/dL (ref 100–199)
HDL: 53 mg/dL (ref >39)
LDL Chol Calc (NIH): 97 mg/dL (ref 0–99)
Triglycerides: 213 mg/dL — ABNORMAL HIGH (ref 0–149)
VLDL Cholesterol Cal: 36 mg/dL (ref 5–40)

## 2021-11-02 LAB — HEMOGLOBIN A1C
Est. average glucose Bld gHb Est-mCnc: 151 mg/dL
Hgb A1c MFr Bld: 6.9 % — ABNORMAL HIGH (ref 4.8–5.6)

## 2021-11-02 LAB — CBC WITH DIFFERENTIAL/PLATELET
Basophils Absolute: 0 10*3/uL (ref 0.0–0.2)
Basos: 1 %
EOS (ABSOLUTE): 0.2 10*3/uL (ref 0.0–0.4)
Eos: 3 %
Hematocrit: 41.2 % (ref 34.0–46.6)
Hemoglobin: 14.3 g/dL (ref 11.1–15.9)
Immature Grans (Abs): 0 10*3/uL (ref 0.0–0.1)
Immature Granulocytes: 0 %
Lymphocytes Absolute: 1.6 10*3/uL (ref 0.7–3.1)
Lymphs: 27 %
MCH: 28.7 pg (ref 26.6–33.0)
MCHC: 34.7 g/dL (ref 31.5–35.7)
MCV: 83 fL (ref 79–97)
Monocytes Absolute: 0.3 10*3/uL (ref 0.1–0.9)
Monocytes: 6 %
Neutrophils Absolute: 3.7 10*3/uL (ref 1.4–7.0)
Neutrophils: 63 %
Platelets: 302 10*3/uL (ref 150–450)
RBC: 4.99 x10E6/uL (ref 3.77–5.28)
RDW: 13.3 % (ref 11.7–15.4)
WBC: 5.8 10*3/uL (ref 3.4–10.8)

## 2021-11-02 LAB — MICROALBUMIN / CREATININE URINE RATIO
Creatinine, Urine: 12.1 mg/dL
Microalb/Creat Ratio: <25 mg/g creat (ref 0–29)
Microalbumin, Urine: <3 ug/mL

## 2021-11-02 LAB — COMPREHENSIVE METABOLIC PANEL
ALT: 25 IU/L (ref 0–32)
AST: 21 IU/L (ref 0–40)
Albumin/Globulin Ratio: 1.9 (ref 1.2–2.2)
Albumin: 4.8 g/dL (ref 3.8–4.9)
Alkaline Phosphatase: 87 IU/L (ref 44–121)
BUN/Creatinine Ratio: 35 — ABNORMAL HIGH (ref 9–23)
BUN: 13 mg/dL (ref 6–24)
Bilirubin Total: 0.4 mg/dL (ref 0.0–1.2)
CO2: 23 mmol/L (ref 20–29)
Calcium: 9.5 mg/dL (ref 8.7–10.2)
Chloride: 104 mmol/L (ref 96–106)
Creatinine, Ser: 0.37 mg/dL — ABNORMAL LOW (ref 0.57–1.00)
Globulin, Total: 2.5 g/dL (ref 1.5–4.5)
Glucose: 110 mg/dL — ABNORMAL HIGH (ref 70–99)
Potassium: 4.2 mmol/L (ref 3.5–5.2)
Sodium: 142 mmol/L (ref 134–144)
Total Protein: 7.3 g/dL (ref 6.0–8.5)
eGFR: 116 mL/min/{1.73_m2} (ref >59)

## 2021-11-02 NOTE — Assessment & Plan Note (Signed)
eat a low fat diet, increase fiber intake (Benefiber or Metamucil, Cherrios,  oatmeal, beans, nuts, fruits and vegetables), limit saturated fats (in fried foods, red meat), can add OTC fish oil supplement, eat fish with Omega-3 fatty acids like salmon and tuna, exercise for 30 minutes 3 - 5 times a week, drink 8 - 10 glasses of water a day   

## 2021-11-02 NOTE — Progress Notes (Signed)
Thank you :)

## 2021-11-02 NOTE — Progress Notes (Signed)
Please call patient with lab results.   She requested a call after 4 pm when her Son will be home and can help interpret for her.  Regarding your recent blood work:  Blood counts are fine, no anemia  Liver and kidneys are fine  Cholesterol levels are a little high; continue to eat a low fat diet and avoid fried and processed foods  Blood sugar levels are okay; continue to eat a low sugar diet, avoid starchy food with a lot of carbohydrates, avoid fried and processed foods  Urinalysis is normal  Urine microalbumin is normal

## 2022-01-01 ENCOUNTER — Emergency Department (HOSPITAL_COMMUNITY): Payer: 59

## 2022-01-01 ENCOUNTER — Emergency Department (HOSPITAL_COMMUNITY)
Admission: EM | Admit: 2022-01-01 | Discharge: 2022-01-02 | Discharge status: Against Medical Advice | Payer: 59 | Attending: Emergency Medicine | Admitting: Emergency Medicine

## 2022-01-01 ENCOUNTER — Other Ambulatory Visit: Payer: Self-pay

## 2022-01-01 DIAGNOSIS — R0789 Other chest pain: Secondary | ICD-10-CM | POA: Diagnosis not present

## 2022-01-01 DIAGNOSIS — Z5321 Procedure and treatment not carried out due to patient leaving prior to being seen by health care provider: Secondary | ICD-10-CM | POA: Insufficient documentation

## 2022-01-01 LAB — CBC WITH DIFFERENTIAL/PLATELET
Abs Immature Granulocytes: 0.01 10*3/uL (ref 0.00–0.07)
Basophils Absolute: 0.1 10*3/uL (ref 0.0–0.1)
Basophils Relative: 1 %
Eosinophils Absolute: 0.1 10*3/uL (ref 0.0–0.5)
Eosinophils Relative: 2 %
HCT: 39.7 % (ref 36.0–46.0)
Hemoglobin: 13.9 g/dL (ref 12.0–15.0)
Immature Granulocytes: 0 %
Lymphocytes Relative: 21 %
Lymphs Abs: 1.3 10*3/uL (ref 0.7–4.0)
MCH: 29.6 pg (ref 26.0–34.0)
MCHC: 35 g/dL (ref 30.0–36.0)
MCV: 84.6 fL (ref 80.0–100.0)
Monocytes Absolute: 0.4 10*3/uL (ref 0.1–1.0)
Monocytes Relative: 7 %
Neutro Abs: 4.2 10*3/uL (ref 1.7–7.7)
Neutrophils Relative %: 69 %
Platelets: 260 10*3/uL (ref 150–400)
RBC: 4.69 MIL/uL (ref 3.87–5.11)
RDW: 13 % (ref 11.5–15.5)
WBC: 6.1 10*3/uL (ref 4.0–10.5)
nRBC: 0 % (ref 0.0–0.2)

## 2022-01-01 LAB — BASIC METABOLIC PANEL
Anion gap: 10 (ref 5–15)
BUN: 19 mg/dL (ref 6–20)
CO2: 24 mmol/L (ref 22–32)
Calcium: 9.3 mg/dL (ref 8.9–10.3)
Chloride: 102 mmol/L (ref 98–111)
Creatinine, Ser: 1.08 mg/dL — ABNORMAL HIGH (ref 0.44–1.00)
GFR, Estimated: 59 mL/min — ABNORMAL LOW (ref >60)
Glucose, Bld: 217 mg/dL — ABNORMAL HIGH (ref 70–99)
Potassium: 3.9 mmol/L (ref 3.5–5.1)
Sodium: 136 mmol/L (ref 135–145)

## 2022-01-01 LAB — TROPONIN I (HIGH SENSITIVITY): Troponin I (High Sensitivity): 4 ng/L (ref <18)

## 2022-01-01 NOTE — ED Triage Notes (Signed)
Pt here for centralized chest pain that started suddenly a few hours ago. Pain radiate to R chest, denies back/arm/neck pain. Pt endorses feeling like she cant catch her  breath, pain is worse w/ inspiration. Pt has labored breathing in triage.

## 2022-01-01 NOTE — ED Provider Triage Note (Signed)
Emergency Medicine Provider Triage Evaluation Note  Aprille Sawhney Nelma Rothman , a 61 y.o. female  was evaluated in triage.  Pt complains of intermittent chest pain since 1 PM today that worsened just prior to arrival.  Radiates towards her right shoulder.  Denies prior history of CAD.  Has history of diabetes.  Denies prior history of DVT, PE.  Denies recent surgery or travel.  Review of Systems  Positive: As above Negative: As above  Physical Exam  BP 129/74 (BP Location: Right Arm)   Pulse 85   Temp 98.3 F (36.8 C) (Oral)   Resp 16   LMP 06/30/2015   SpO2 96%  Gen:   Awake, no distress   Resp:  Normal effort  MSK:   Moves extremities without difficulty  Other:    Medical Decision Making  Medically screening exam initiated at 8:04 PM.  Appropriate orders placed.  Jacalyn Lefevre Zariah Cavendish was informed that the remainder of the evaluation will be completed by another provider, this initial triage assessment does not replace that evaluation, and the importance of remaining in the ED until their evaluation is complete.    Marita [place], PA-C 01/01/22 2005

## 2022-01-02 ENCOUNTER — Ambulatory Visit (INDEPENDENT_AMBULATORY_CARE_PROVIDER_SITE_OTHER): Payer: 59 | Admitting: Family Medicine

## 2022-01-02 ENCOUNTER — Encounter: Payer: Self-pay | Admitting: Family Medicine

## 2022-01-02 VITALS — BP 110/70 | HR 86 | Wt 174.8 lb

## 2022-01-02 DIAGNOSIS — E1165 Type 2 diabetes mellitus with hyperglycemia: Secondary | ICD-10-CM

## 2022-01-02 DIAGNOSIS — R0789 Other chest pain: Secondary | ICD-10-CM

## 2022-01-02 DIAGNOSIS — K219 Gastro-esophageal reflux disease without esophagitis: Secondary | ICD-10-CM | POA: Diagnosis not present

## 2022-01-02 LAB — TROPONIN I (HIGH SENSITIVITY): Troponin I (High Sensitivity): 3 ng/L (ref <18)

## 2022-01-02 NOTE — ED Notes (Signed)
Patient states the wait is too long and she is leaving 

## 2022-01-02 NOTE — Progress Notes (Signed)
Chief Complaint  Patient presents with   Traci Young follow up on chest pain.   Patients present for f/u on chest pain. Pain started yesterday at 1pm, got worse throughout the day--felt pressure, hard to breathe, so went to the ER.  She left without being seen, left early this morning.  Per ER triage note, had CP radiating to R shoulder.  She currently has slight discomfort at the mid-lower sternum, which is somewhat tender to touch, and also hurts some if she leans forward.  It is significantly less than yesterday, no longer feel any shortness of breath.  ER evaluation was reviewed-- Troponin x 2 normal. B-met--glu 217, Cr 1.08, rest normal Normal CBC EKG normal CXR normal  She hasn't taken any medication for the pain. Friday--took 2  advil (for joint pain, hips), uses this occasionally, no tregularly. Occ heartburn since gall bladder out. No diarrhea. Occ uses antacids    PMH, PSH, SH reviewed DM Lab Results  Component Value Date   HGBA1C 6.9 (H) 11/01/2021   Outpatient Encounter Medications as of 01/02/2022  Medication Sig   atorvastatin (LIPITOR) 10 MG tablet Take 1 tablet (10 mg total) by mouth in the morning.   cholecalciferol (VITAMIN D3) 25 MCG (1000 UNIT) tablet Take 1 tablet (1,000 Units total) by mouth daily.   Glucose Blood (BLOOD GLUCOSE TEST STRIPS) STRP Test twice a day. Pt has one touch verio meter   metFORMIN (GLUCOPHAGE) 1000 MG tablet Take 1 tablet (1,000 mg total) by mouth 2 (two) times daily with a meal.   OneTouch Delica Lancets 16B MISC 1 each by Does not apply route 2 (two) times daily after a meal.   clobetasol ointment (TEMOVATE) 0.05 % Apply topically 2 (two) times daily. (Patient not taking: Reported on 11/01/2021)   No facility-administered encounter medications on file as of 01/02/2022.   No Known Allergies  ROS: no fever, chills, URI symptoms, n/v/d, shortness of breath.  Mild persistent mid-sternal CP.  Occ heartburn per HPI. No  headache, dizziness or other complaints.   PHYSICAL EXAM:  BP 110/70   Pulse 86   Wt 174 lb 12.8 oz (79.3 kg)   LMP 06/30/2015   SpO2 96%   BMI 31.46 kg/m   Pleasant, well-appearing overweight female in no distress HEENT: conjunctiva and sclera are clear, EOMI Neck: no lymphadenopathy, thyromegaly or mass Heart: regular rate and rhythm, no murmur Lungs: clear bilaterally, normal air movement Chest: tender at at lower sternum, centrally. Nontender at the costochondral junctions.  Nontender elsewhere on chest Back: no CVA tenderness Extremities: no edema Psych: normal mood, affect, hygiene and grooming Neuro: alert and oriented, normal speech, gait  ASSESSMENT/PLAN:  Chest wall pain - reassured non-cardiac, reviewed all test/imaging/EKG results from ER.  Gastroesophageal reflux disease, unspecified whether esophagitis present - has some intermittent reflux, can't r/o GERD component to her discomfort. Discussed PPI vs H2 blocker, if any issues from NSAIDs  Type 2 diabetes mellitus with hyperglycemia, without long-term current use of insulin (HCC) - glu high in ER, but overall controlled, last A1c <7 in June   Your pain is not from your heart. It seems to be over the sternum, superficially.  Take ibuprofen (advil, motrin) 290m--take 3 tablets (6063mdose) with food three times daily until the chest pain is better (should be within 7 days. Use warm compresses (heating pad or hot towel) 3 times/day, as needed. Return if there is worsening pain, or other concerns.  If the advil bothers your  stomach, you can reduce the dose to 2 pills (443m), and take some pepcid or prilosec until you no longer need to take advil.

## 2022-01-02 NOTE — Patient Instructions (Signed)
Your pain is not from your heart. It seems to be over the sternum, superficially.  Take ibuprofen (advil, motrin) 200mg --take 3 tablets (600mg  dose) with food three times daily until the chest pain is better (should be within 7 days. Use warm compresses (heating pad or hot towel) 3 times/day, as needed. Return if there is worsening pain, or other concerns.  If the advil bothers your stomach, you can reduce the dose to 2 pills (400mg ), and take some pepcid or prilosec until you no longer need to take advil.

## 2022-02-01 ENCOUNTER — Ambulatory Visit: Payer: 59 | Admitting: Medical

## 2022-02-25 IMAGING — MR MR SHOULDER*R* W/O CM
4 of 6 series · 21 of 40 positions shown · non-contrast
Comparison: None.

CLINICAL DATA: Right shoulder pain.  Pain for 11 years.

EXAM:
MRI OF THE RIGHT SHOULDER WITHOUT CONTRAST
TECHNIQUE: Multiplanar, multisequence MR imaging of the shoulder was performed.
No intravenous contrast was administered.

[Series 3: T2 fat-sat · axial · 4.0mm · 0.27mm/px · z∈[-62,+56]mm · 6 of 30 slices shown (1 of 2)]
[im 1/30]
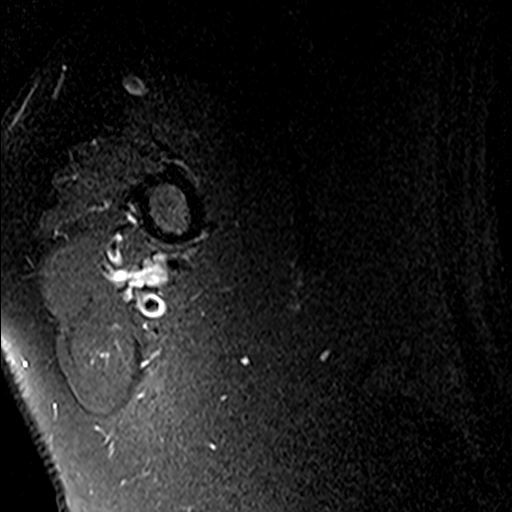
[im 4/30]
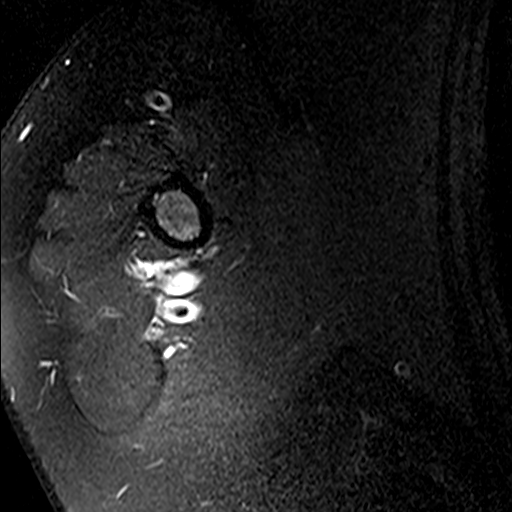
[im 10/30]
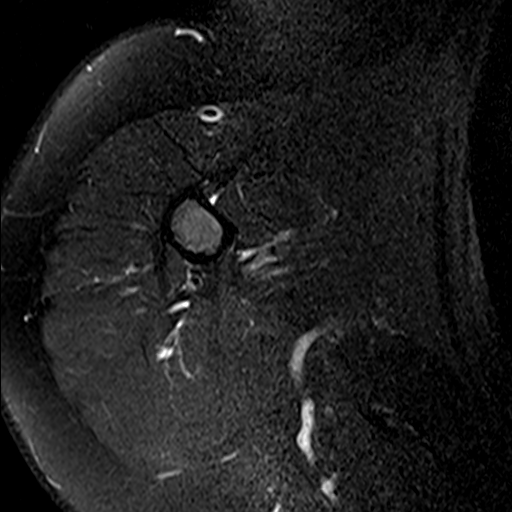
[im 13/30]
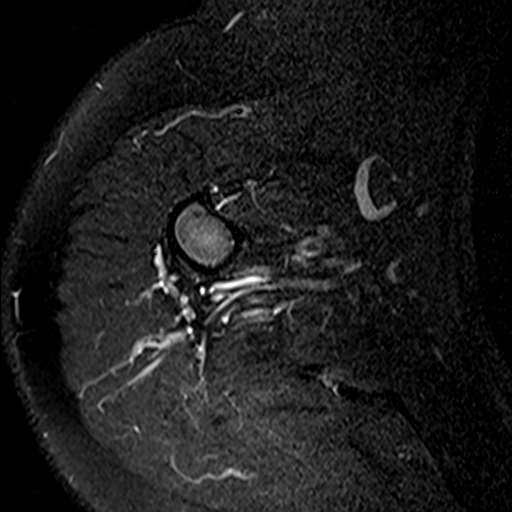
[im 17/30]
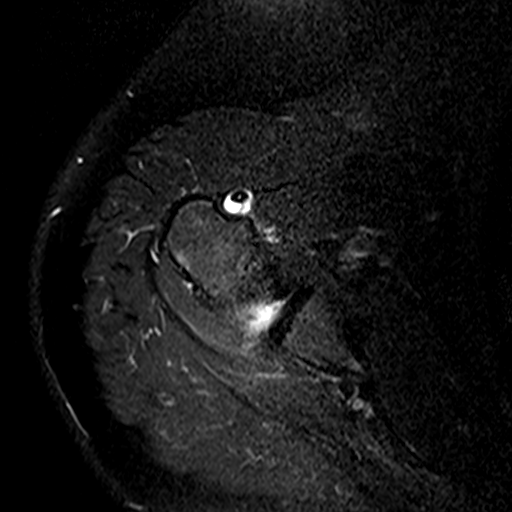
[im 26/30]
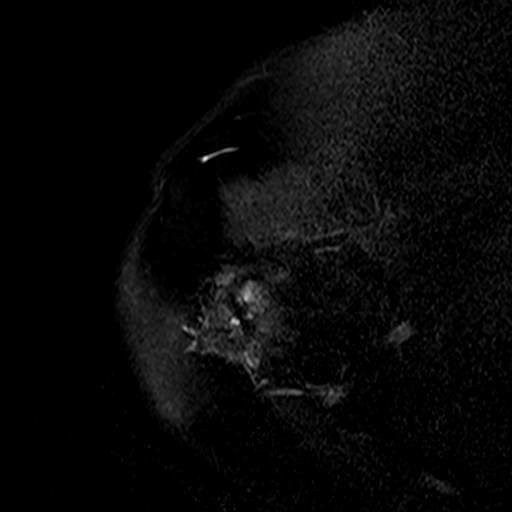

[Series 4: PD · oblique · 4.0mm · 0.27mm/px · 6 of 18 slices shown (1 of 2)]
[im 1/18]
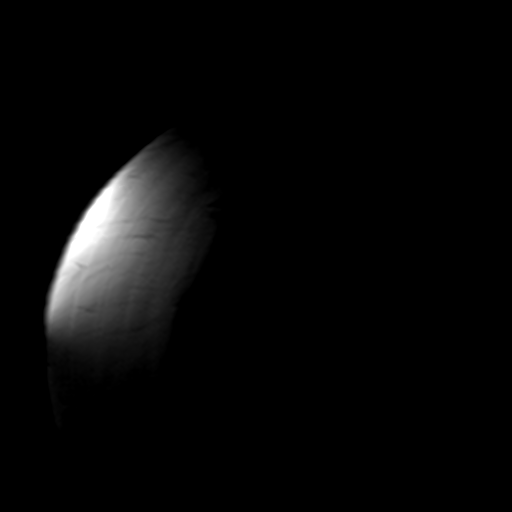
[im 4/18]
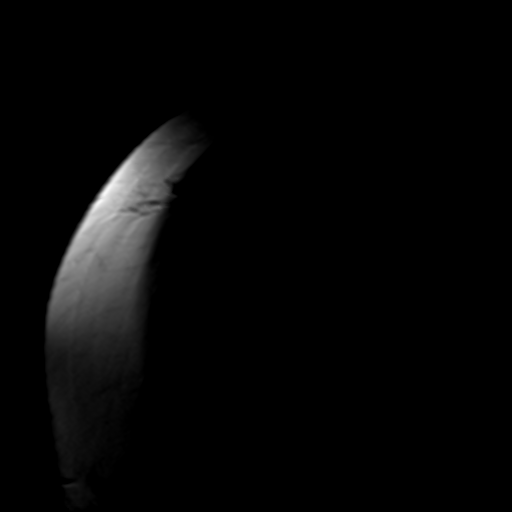
[im 7/18]
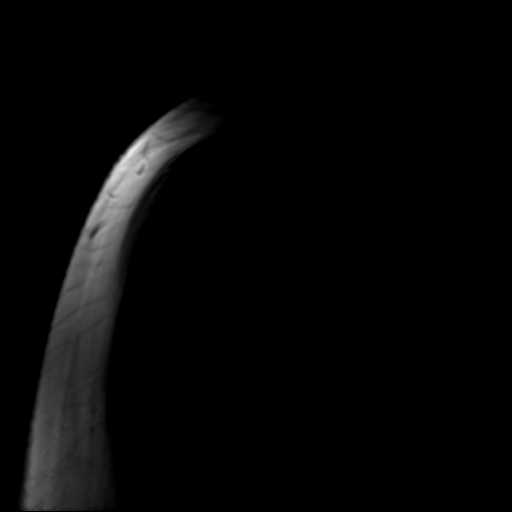
[im 11/18]
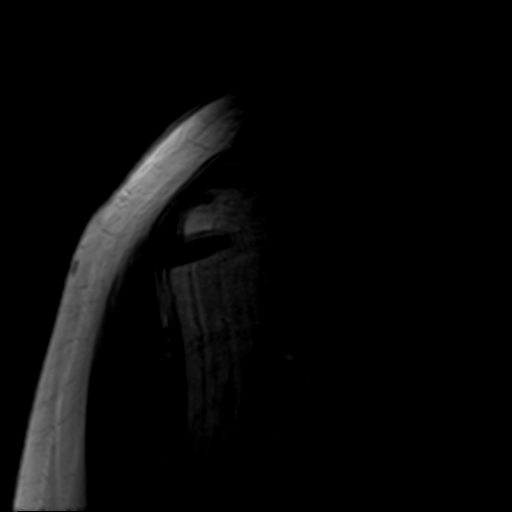
[im 14/18]
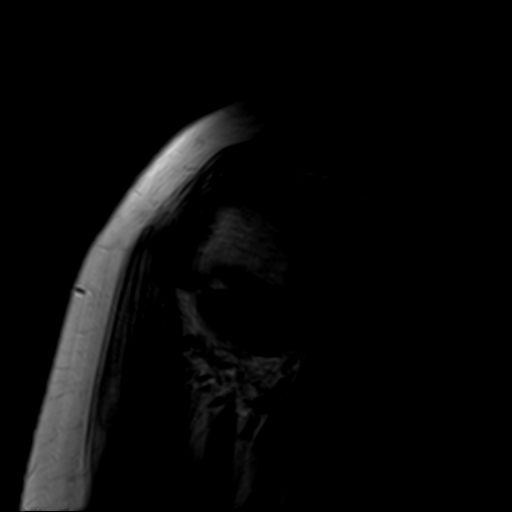
[im 18/18]
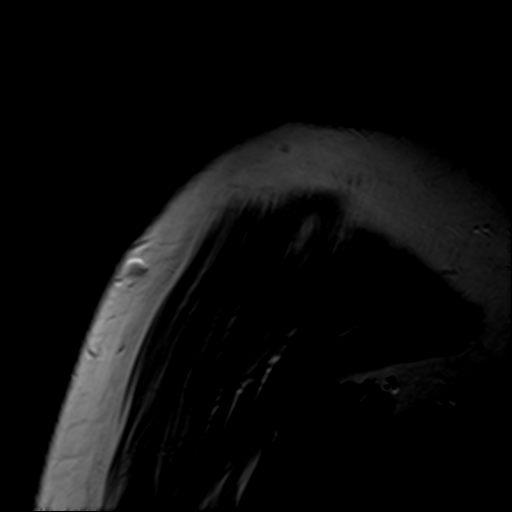

[Series 5: T2 fat-sat · oblique · 4.0mm · 0.55mm/px · 3 of 18 slices shown (2 of 2)]
[im 4/18]
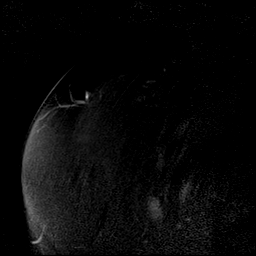
[im 11/18]
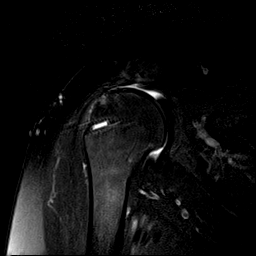
[im 18/18]
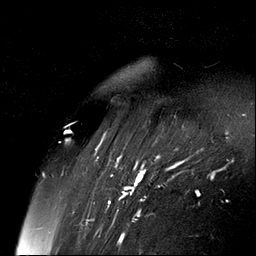

[Series 6: PD · oblique · 4.0mm · 0.27mm/px · 6 of 18 slices shown (2 of 2)]
[im 1/18]
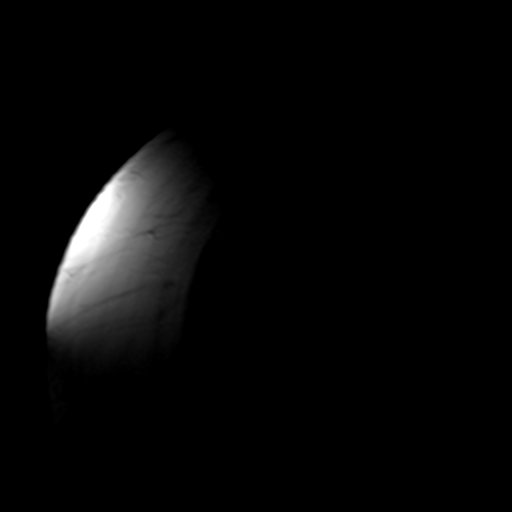
[im 4/18]
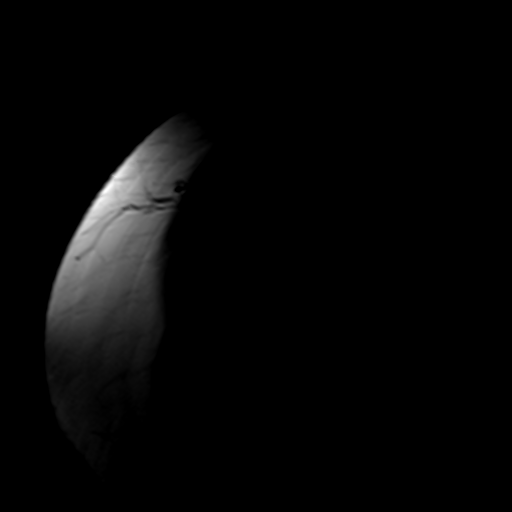
[im 7/18]
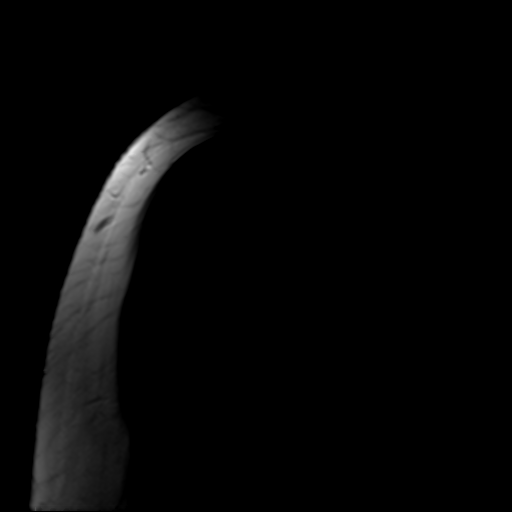
[im 11/18]
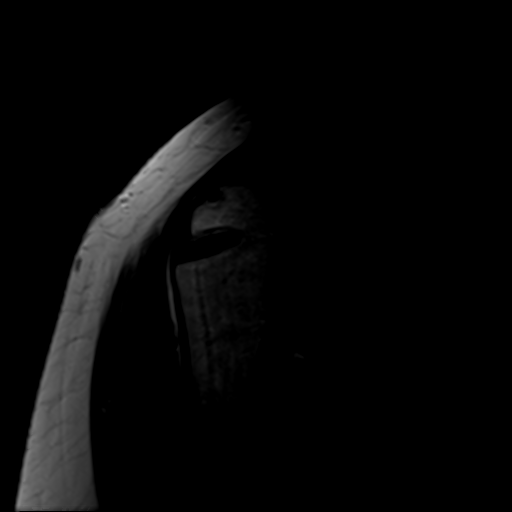
[im 14/18]
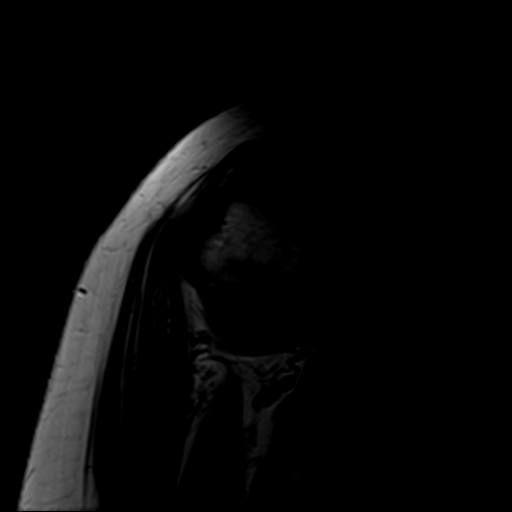
[im 18/18]
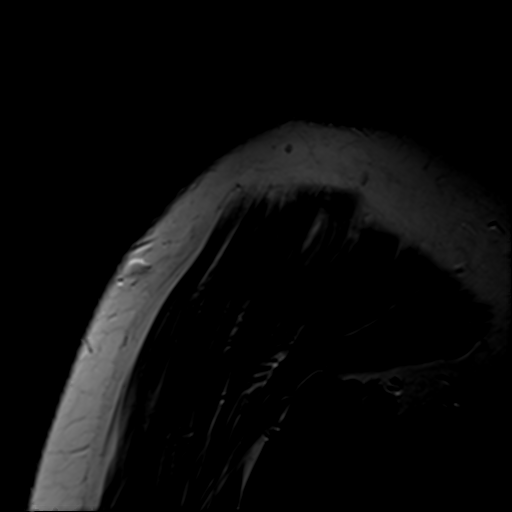

[21 of 40 positions shown; findings below may reference images not displayed]

FINDINGS: Rotator cuff: Prior rotator cuff repair. Complete tear of the
supraspinatus tendon with 3.8 cm of retraction. Mild tendinosis of
the infraspinatus tendon. Teres minor tendon is intact. Mild
tendinosis of the subscapularis tendon.

Muscles: No muscle atrophy or edema. No intramuscular fluid
collection or hematoma.

Biceps Long Head: Moderate tendinosis of the intra-articular portion
of the long head of the biceps tendon. Interstitial tear of the
proximal extra-articular portion of the long head of the biceps
tendon.

Acromioclavicular Joint: Mild arthropathy of the acromioclavicular
joint. No subacromial/subdeltoid bursal fluid.

Glenohumeral Joint: Small joint effusion. Mild partial-thickness
cartilage loss of the glenohumeral joint.

Labrum: Grossly intact, but evaluation is limited by lack of
intraarticular fluid/contrast.

Bones: No fracture or dislocation. No aggressive osseous lesion.

Other: No fluid collection or hematoma.
IMPRESSION: 1. Prior rotator cuff repair. Complete tear of the supraspinatus
tendon with 3.8 cm of retraction.
2. Mild tendinosis of the infraspinatus tendon.
3. Mild tendinosis of the subscapularis tendon.
4. Moderate tendinosis of the intra-articular portion of the long
head of the biceps tendon. Interstitial tear of the proximal
extra-articular portion of the long head of the biceps tendon.

## 2022-02-27 ENCOUNTER — Encounter: Payer: Self-pay | Admitting: Family Medicine

## 2022-02-27 ENCOUNTER — Ambulatory Visit (INDEPENDENT_AMBULATORY_CARE_PROVIDER_SITE_OTHER): Payer: Commercial Managed Care - HMO | Admitting: Family Medicine

## 2022-02-27 VITALS — BP 128/80 | HR 91 | Temp 98.6°F | Wt 170.0 lb

## 2022-02-27 DIAGNOSIS — H6692 Otitis media, unspecified, left ear: Secondary | ICD-10-CM | POA: Diagnosis not present

## 2022-02-27 DIAGNOSIS — L309 Dermatitis, unspecified: Secondary | ICD-10-CM | POA: Diagnosis not present

## 2022-02-27 MED ORDER — AMOXICILLIN 875 MG PO TABS: 875.0000 mg | ORAL_TABLET | Freq: Two times a day (BID) | ORAL | 0 refills | Status: DC

## 2022-02-27 NOTE — Progress Notes (Signed)
   Subjective:    Patient ID: Traci Young, female    DOB: [birth year], 61 y.o.   MRN: [id number]  HPI  She complains of a lesion on her face that she states has been there off and on for several years.  She also complains of a 3-day history of sore throat and left earache but no fever, chills, lymph node swelling, coughing.  Review of Systems     Objective:   Physical Exam Alert and in no distress.  Slightly erythematous round lesion noted on the left cheek .tympanic membrane on the left is slightly dull, right is normal, canals are normal. Pharyngeal area is normal. Neck is supple without adenopathy or thyromegaly. Cardiac exam shows a regular sinus rhythm without murmurs or gallops. Lungs are clear to auscultation.        Assessment & Plan:  Left otitis media, unspecified otitis media type - Plan: amoxicillin (AMOXIL) 875 MG tablet  Dermatitis Recommend cortisone cream for the lesion on her face.

## 2022-02-27 NOTE — Patient Instructions (Signed)
Use Cortaid cream on the rash on your face

## 2022-03-09 ENCOUNTER — Telehealth: Payer: Self-pay | Admitting: Physician Assistant

## 2022-03-09 NOTE — Telephone Encounter (Signed)
Pt's daughter came in and dropped off a disability parking placard. This is a previous pt of [place]. Daughter states that pt has a long history of shoulder issues. Sending back to [facility] as providers no long er with [facility]. Please call daughter, Elias Else at [phone] when ready.

## 2022-03-10 NOTE — Telephone Encounter (Signed)
Pt's daughter called back about form.  She states pt has issues with her arm  and she mentioned she has had laser surgery on left leg also  They did scheduled medcheck for Nov 15  Pt did have physical here in June 2023

## 2022-03-13 NOTE — Telephone Encounter (Signed)
Left message for pt to come back to pick up form.  She has an appt 11/15

## 2022-03-28 ENCOUNTER — Encounter: Payer: Self-pay | Admitting: Internal Medicine

## 2022-03-29 ENCOUNTER — Ambulatory Visit (INDEPENDENT_AMBULATORY_CARE_PROVIDER_SITE_OTHER): Payer: Commercial Managed Care - HMO | Admitting: Medical

## 2022-03-29 VITALS — BP 120/70 | HR 97 | Wt 163.8 lb

## 2022-03-29 DIAGNOSIS — M8580 Other specified disorders of bone density and structure, unspecified site: Secondary | ICD-10-CM

## 2022-03-29 DIAGNOSIS — Z7185 Encounter for immunization safety counseling: Secondary | ICD-10-CM

## 2022-03-29 DIAGNOSIS — E782 Mixed hyperlipidemia: Secondary | ICD-10-CM

## 2022-03-29 DIAGNOSIS — Z23 Encounter for immunization: Secondary | ICD-10-CM

## 2022-03-29 DIAGNOSIS — E1169 Type 2 diabetes mellitus with other specified complication: Secondary | ICD-10-CM | POA: Diagnosis not present

## 2022-03-29 DIAGNOSIS — E559 Vitamin D deficiency, unspecified: Secondary | ICD-10-CM | POA: Diagnosis not present

## 2022-03-29 DIAGNOSIS — E785 Hyperlipidemia, unspecified: Secondary | ICD-10-CM

## 2022-03-29 DIAGNOSIS — Z789 Other specified health status: Secondary | ICD-10-CM

## 2022-03-29 LAB — POCT GLYCOSYLATED HEMOGLOBIN (HGB A1C): Hemoglobin A1C: 9.7 % — AB (ref 4.0–5.6)

## 2022-03-29 MED ORDER — TIRZEPATIDE 2.5 MG/0.5ML ~~LOC~~ SOAJ: 2.5000 mg | SUBCUTANEOUS | 0 refills | Status: DC

## 2022-03-29 MED ORDER — VITAMIN D 25 MCG (1000 UNIT) PO TABS
1000.0000 [IU] | ORAL_TABLET | Freq: Every day | ORAL | 3 refills | Status: DC
Start: 2022-03-29 — End: 2022-11-09

## 2022-03-29 MED ORDER — TIRZEPATIDE 5 MG/0.5ML ~~LOC~~ SOAJ: 5.0000 mg | SUBCUTANEOUS | 1 refills | Status: DC

## 2022-03-29 MED ORDER — METFORMIN HCL 1000 MG PO TABS: 1000.0000 mg | ORAL_TABLET | Freq: Two times a day (BID) | ORAL | 1 refills | Status: DC

## 2022-03-29 MED ORDER — ATORVASTATIN CALCIUM 10 MG PO TABS: 10.0000 mg | ORAL_TABLET | Freq: Every morning | ORAL | 3 refills | Status: DC

## 2022-03-29 MED ORDER — FREESTYLE LIBRE 3 SENSOR MISC: 1.0000 | 11 refills | Status: DC

## 2022-03-29 NOTE — Patient Instructions (Addendum)
Diabetes  Hgba1c 9.7 % today.   Unfortunately her diet has not been good lately, stress eating.  She had a great HgbA1C in June, but now morning sugars are high.   Continue metformin 1000mg  BID Add weekly Mounjaro, increasing dose monthly Continue checking glucose regularly  See eye doctor yearly for diabetes eye scan and have them send copy of the notes.  Hyperlipidemia-continue atorvastatin 10 mg daily  Osteopenia - get aerobic  exercise most days per week, and weight bearing exercise at least 2 days per week  Vitamin D deficiency-I recommend you take 2000 units of vitamin D daily and get at least 1200 mg of calcium daily through supplement or diet   Vaccines: Counseled on the Shingrix vaccine.  Vaccine information sheet given.  Return soon for flu and covid vaccines    Diabetes Hgba1c 9,7% hoy. Desafortunadamente su dieta no ha sido buena ltimamente, [place]. Tuvo un nivel excelente de HgbA1C en junio, pero ahora los Emergency planning/management officer de azcar en la maana estn altos. Continuar con metformina 1000 mg [employer]. Agregue Mounjaro semanalmente, aumentando la dosis mensualmente Contine controlando la glucosa regularmente  Consulte al oculista anualmente para [employer] un examen ocular para la diabetes y pdale que nos enve una copia de las notas.  Hiperlipidemia: continuar con atorvastatina 10 mg al da  Osteopenia: haga ejercicio aerbico la Education officer, environmental de la semana y ejercicio con pesas al menos 2 das por semana.  Deficiencia de vitamina D: le recomiendo tomar 2000 unidades de vitamina D al da y obtener al menos 1200 mg de calcio al da a travs de un suplemento o una dieta.   Vacunas: Aconsejado sobre la Apr 2000. Se entrega hoja de informacin sobre vacunas.  Regrese pronto para vacunas contra la gripe y el covid

## 2022-03-29 NOTE — Progress Notes (Signed)
Subjective:  Traci Young is a 61 y.o. female who presents for Chief Complaint  Patient presents with   medication check    Med check, issues with Sugars. It can be in the 200's, discuss ozempic medication- would like to start on this     Here for routine med check, chronic disease management.  Here with [name] who interprets today  Her medical history includes diabetes, hyperlipidemia, vitamin D deficiency.  Diabetes-compliant with metformin 1000 mg twice daily,  checks sugars BID.  Fasting numbers in the morning typically 190-200s average.   +having some polyuria and polydipsia.    No prior insulin.  No prior GLP1 but is interested in this.   Hyperlipidemia-compliant with atorvastatin 10 mg daily  Vitamin D deficiency-compliant vitamin D 1000 is daily  Trying to get on disability.  Not currently able to work.   She stays stressed.  She lives at home with husband and son.   Daughter is helping with mortgage.  There is a lot of financial stress.     No other aggravating or relieving factors.    No other c/o.  Past Medical History:  Diagnosis Date   CHEST PAIN 03/15/2009   Qualifier: Diagnosis of  By: Caryl Comes, MD, [facility], Mack Guise    Diabetes mellitus, new onset ([place]) 05/19/2019   Hgb A1c >14%   Elevated lipids    no meds, diet  controlled   Gum inflammation 04/05/2021   HYPERCHOLESTEROLEMIA 03/15/2009   Qualifier: Diagnosis of  By: Caryl Comes, MD, Remus Blake    Obesity    Osteopenia determined by x-ray 09/01/2020   OVERWEIGHT 03/15/2009   Qualifier: Diagnosis of  By: Caryl Comes, MD, Remus Blake    SVD (spontaneous vaginal delivery)    x 4   Vitamin deficiency 04/05/2021   Current Outpatient Medications on File Prior to Visit  Medication Sig Dispense Refill   clobetasol ointment (TEMOVATE) 0.05 % Apply topically 2 (two) times daily.     Glucose Blood (BLOOD GLUCOSE TEST STRIPS) STRP Test twice a day. Pt has one touch verio meter 100 strip 2    OneTouch Delica Lancets [id number] MISC 1 each by Does not apply route 2 (two) times daily after a meal. 100 each 1   No current facility-administered medications on file prior to visit.    The following portions of the patient's history were reviewed and updated as appropriate: allergies, current medications, past family history, past medical history, past social history, past surgical history and problem list.  ROS Otherwise as in subjective above    Objective: BP 120/70   Pulse 97   Wt 163 lb 12.8 oz (74.3 kg)   LMP 06/30/2015   BMI 29.48 kg/m   Wt Readings from Last 3 Encounters:  03/29/22 163 lb 12.8 oz (74.3 kg)  02/27/22 170 lb (77.1 kg)  01/02/22 174 lb 12.8 oz (79.3 kg)   BP Readings from Last 3 Encounters:  03/29/22 120/70  02/27/22 128/80  01/02/22 110/70    General appearance: alert, no distress, well developed, well nourished Neck: supple, no lymphadenopathy, no thyromegaly, no masses Heart: RRR, normal S1, S2, no murmurs Lungs: CTA bilaterally, no wheezes, rhonchi, or rales Pulses: 2+ radial pulses, 2+ pedal pulses, normal cap refill Ext: no edema   Diabetic Foot Exam - Simple   Simple Foot Form Diabetic Foot exam was performed with the following findings: Yes 03/29/2022  2:20 PM  Visual Inspection See comments: Yes Sensation Testing Intact to touch and monofilament  testing bilaterally: Yes Pulse Check Posterior Tibialis and Dorsalis pulse intact bilaterally: Yes Comments Hammer toes bilat of several toes      Assessment: Encounter Diagnoses  Name Primary?   Type 2 diabetes mellitus with other specified complication, without long-term current use of insulin (HCC) Yes   Need for shingles vaccine    Hyperlipidemia associated with type 2 diabetes mellitus (HCC)    Osteopenia determined by x-ray    Vitamin D deficiency    Language barrier    Vaccine counseling    Mixed hyperlipidemia      Plan: Diabetes  Hgba1c 9.7 % today.   Unfortunately her  diet has not been good lately, stress eating.  She had a great HgbA1C in June, but now morning sugars are high.   Continue metformin 1000mg  BID Add weekly Mounjaro, increasing dose monthly Continue checking glucose regularly  See eye doctor yearly for diabetes eye scan and have them send copy of the notes.  Hyperlipidemia-continue atorvastatin 10 mg daily  Osteopenia - get aerobic  exercise most days per week, and weight bearing exercise at least 2 days per week  Vitamin D deficiency-I recommend you take 2000 units of vitamin D daily and get at least 1200 mg of calcium daily through supplement or diet   Vaccines: Counseled on the Shingrix vaccine.  Vaccine information sheet given.  Return soon for flu and covid vaccines    [name] was seen today for medication check.  Diagnoses and all orders for this visit:  Type 2 diabetes mellitus with other specified complication, without long-term current use of insulin (HCC)  Need for shingles vaccine -     Varicella-zoster vaccine IM  Hyperlipidemia associated with type 2 diabetes mellitus (HCC) -     atorvastatin (LIPITOR) 10 MG tablet; Take 1 tablet (10 mg total) by mouth in the morning. -     metFORMIN (GLUCOPHAGE) 1000 MG tablet; Take 1 tablet (1,000 mg total) by mouth 2 (two) times daily with a meal.  Osteopenia determined by x-ray  Vitamin D deficiency  Language barrier  Vaccine counseling  Mixed hyperlipidemia -     atorvastatin (LIPITOR) 10 MG tablet; Take 1 tablet (10 mg total) by mouth in the morning.  Other orders -     tirzepatide [facility]) 2.5 MG/0.5ML Pen; Inject 2.5 mg into the skin once a week. -     cholecalciferol (VITAMIN D3) 25 MCG (1000 UNIT) tablet; Take 1 tablet (1,000 Units total) by mouth daily. -     Continuous Blood Gluc Sensor (FREESTYLE LIBRE 3 SENSOR) MISC; 1 each by Does not apply route every 14 (fourteen) days. Place 1 sensor on the skin every 14 days. Use to check glucose continuously -      tirzepatide [facility]) 5 MG/0.5ML Pen; Inject 5 mg into the skin once a week.   Follow up: 68mo

## 2022-03-29 NOTE — Addendum Note (Signed)
Addended by: Debbrah Alar F on: 03/29/2022 02:36 PM   Modules accepted: Orders

## 2022-04-03 ENCOUNTER — Telehealth: Payer: Self-pay | Admitting: Medical

## 2022-04-03 NOTE — Telephone Encounter (Signed)
Pt had someone call for her and states that her Freestyle Libre 3 fell off and she can get another one on due to it being bent. She wants to know of we can giver her one and place it for her. Please advise pt at [phone].

## 2022-04-03 NOTE — Telephone Encounter (Signed)
Yes have her come in and we can give her another sample

## 2022-04-05 ENCOUNTER — Other Ambulatory Visit: Payer: Commercial Managed Care - HMO

## 2022-04-05 MED ORDER — FREESTYLE LIBRE 14 DAY READER DEVI: 0 refills | Status: DC

## 2022-04-05 MED ORDER — FREESTYLE LIBRE READER DEVI: 0 refills | Status: DC

## 2022-04-12 ENCOUNTER — Other Ambulatory Visit: Payer: Commercial Managed Care - HMO

## 2022-04-12 ENCOUNTER — Other Ambulatory Visit (INDEPENDENT_AMBULATORY_CARE_PROVIDER_SITE_OTHER): Payer: Commercial Managed Care - HMO

## 2022-04-12 DIAGNOSIS — Z23 Encounter for immunization: Secondary | ICD-10-CM

## 2022-04-17 ENCOUNTER — Telehealth: Payer: Self-pay | Admitting: Internal Medicine

## 2022-04-17 MED ORDER — TIRZEPATIDE 5 MG/0.5ML ~~LOC~~ SOAJ: 5.0000 mg | SUBCUTANEOUS | 1 refills | Status: DC

## 2022-04-17 NOTE — Telephone Encounter (Signed)
Pt has an upcoming appt

## 2022-04-21 DIAGNOSIS — H524 Presbyopia: Secondary | ICD-10-CM | POA: Insufficient documentation

## 2022-04-21 DIAGNOSIS — H2513 Age-related nuclear cataract, bilateral: Secondary | ICD-10-CM | POA: Insufficient documentation

## 2022-04-21 DIAGNOSIS — H25013 Cortical age-related cataract, bilateral: Secondary | ICD-10-CM | POA: Insufficient documentation

## 2022-05-01 ENCOUNTER — Encounter: Payer: 59 | Admitting: Medical

## 2022-05-03 ENCOUNTER — Encounter: Payer: Self-pay | Admitting: Medical

## 2022-05-03 ENCOUNTER — Ambulatory Visit (INDEPENDENT_AMBULATORY_CARE_PROVIDER_SITE_OTHER): Payer: Commercial Managed Care - HMO | Admitting: Medical

## 2022-05-03 ENCOUNTER — Other Ambulatory Visit: Payer: Self-pay | Admitting: Medical

## 2022-05-03 ENCOUNTER — Telehealth: Payer: Self-pay | Admitting: Internal Medicine

## 2022-05-03 VITALS — BP 124/80 | HR 83 | Temp 98.3°F | Wt 157.6 lb

## 2022-05-03 DIAGNOSIS — E559 Vitamin D deficiency, unspecified: Secondary | ICD-10-CM

## 2022-05-03 DIAGNOSIS — E1169 Type 2 diabetes mellitus with other specified complication: Secondary | ICD-10-CM | POA: Diagnosis not present

## 2022-05-03 DIAGNOSIS — Z6831 Body mass index (BMI) 31.0-31.9, adult: Secondary | ICD-10-CM

## 2022-05-03 DIAGNOSIS — E119 Type 2 diabetes mellitus without complications: Secondary | ICD-10-CM | POA: Diagnosis not present

## 2022-05-03 DIAGNOSIS — E785 Hyperlipidemia, unspecified: Secondary | ICD-10-CM

## 2022-05-03 MED ORDER — OZEMPIC (0.25 OR 0.5 MG/DOSE) 2 MG/3ML ~~LOC~~ SOPN: 0.2500 mg | PEN_INJECTOR | SUBCUTANEOUS | 2 refills | Status: DC

## 2022-05-03 MED ORDER — BYDUREON BCISE 2 MG/0.85ML ~~LOC~~ AUIJ: 2.0000 mg | AUTO-INJECTOR | SUBCUTANEOUS | 2 refills | Status: DC

## 2022-05-03 NOTE — Telephone Encounter (Signed)
Pt plan  does not cover ozempic. Per Rx benefit plan alternative medication is Bydureon Bcise. Please send in new rx for bcise

## 2022-05-03 NOTE — Telephone Encounter (Signed)
Pt was notified of results

## 2022-05-03 NOTE — Patient Instructions (Addendum)
I am glad you are feeling good and seeing better sugar numbers  I called your pharmacy  Your insurance is not going to cover Mounjaro.  I sent Ozempic a similar medication to pharmacy.  Lets try this.  If this is also NOT covered, the pharmacist thinks they weill cover a medication called Trulicity.   It is also a weekly pen device.  The plan would be to increase dose in 1 month  Continue your other medication as usual       Me alegra que te sientas bien y veas mejores cifras de azcar.  Llam a tu farmacia  Tu seguro no va a cubrir a Mounjaro. Envi a Ozempic un medicamento similar a la farmacia. Intentemos esto.  Si esto tampoco est cubierto, el farmacutico cree que cubrir un medicamento llamado Trulicity. Tambin es un dispositivo de [employer]. El plan sera aumentar dosis en 1 mes  Contine con su otra medicacin como de costumbre.

## 2022-05-03 NOTE — Progress Notes (Signed)
Subjective:  Traci Young is a 61 y.o. female who presents for Chief Complaint  Patient presents with   other    Weight loss f/u down about 7 pounds      Here for 1 mo f/u.   Last month we started mounjaro.  She took it 1 month but insurance wont cover this.  She has seen good sugar numbers and she is feeling good on this medication the past month.   Wants to continue similar.   Exercising some.    Her medical history includes diabetes, hyperlipidemia, vitamin D deficiency.  Diabetes-compliant with metformin 1000 mg twice daily, mounjaro but runs out this week. Hyperlipidemia-compliant with atorvastatin 10 mg daily  Vitamin D deficiency-compliant vitamin D 1000 is daily  No other aggravating or relieving factors.    No other c/o.  Past Medical History:  Diagnosis Date   CHEST PAIN 03/15/2009   Qualifier: Diagnosis of  By: Traci Husbands, MD, Traci Young    Diabetes mellitus, new onset (HCC) 05/19/2019   Hgb A1c >14%   Elevated lipids    no meds, diet  controlled   Gum inflammation 04/05/2021   HYPERCHOLESTEROLEMIA 03/15/2009   Qualifier: Diagnosis of  By: Traci Husbands, MD, Traci Young    Obesity    Osteopenia determined by x-ray 09/01/2020   OVERWEIGHT 03/15/2009   Qualifier: Diagnosis of  By: Traci Husbands, MD, Traci Young    SVD (spontaneous vaginal delivery)    x 4   Vitamin deficiency 04/05/2021   Current Outpatient Medications on File Prior to Visit  Medication Sig Dispense Refill   atorvastatin (LIPITOR) 10 MG tablet Take 1 tablet (10 mg total) by mouth in the morning. 90 tablet 3   cholecalciferol (VITAMIN D3) 25 MCG (1000 UNIT) tablet Take 1 tablet (1,000 Units total) by mouth daily. 90 tablet 3   clobetasol ointment (TEMOVATE) 0.05 % Apply topically 2 (two) times daily.     Continuous Blood Gluc Receiver (FREESTYLE LIBRE 14 DAY READER) DEVI Use for libre 3 sensor 1 each 0   Continuous Blood Gluc Sensor (FREESTYLE LIBRE 3 SENSOR) MISC 1 each by  Does not apply route every 14 (fourteen) days. Place 1 sensor on the skin every 14 days. Use to check glucose continuously 2 each 11   Glucose Blood (BLOOD GLUCOSE TEST STRIPS) STRP Test twice a day. Pt has one touch verio meter 100 strip 2   metFORMIN (GLUCOPHAGE) 1000 MG tablet Take 1 tablet (1,000 mg total) by mouth 2 (two) times daily with a meal. 180 tablet 1   OneTouch Delica Lancets 30G MISC 1 each by Does not apply route 2 (two) times daily after a meal. 100 each 1   tirzepatide (MOUNJARO) 2.5 MG/0.5ML Pen Inject 2.5 mg into the skin once a week. (Patient not taking: Reported on 05/03/2022) 2 mL 0   tirzepatide (MOUNJARO) 5 MG/0.5ML Pen Inject 5 mg into the skin once a week. (Patient not taking: Reported on 05/03/2022) 6 mL 1   No current facility-administered medications on file prior to visit.    The following portions of the patient's history were reviewed and updated as appropriate: allergies, current medications, past family history, past medical history, past social history, past surgical history and problem list.  ROS Otherwise as in subjective above    Objective: BP 124/80   Pulse 83   Temp 98.3 F (36.8 C)   Wt 157 lb 9.6 oz (71.5 kg)   LMP 06/30/2015   BMI 28.37 kg/m  Wt Readings from Last 3 Encounters:  05/03/22 157 lb 9.6 oz (71.5 kg)  03/29/22 163 lb 12.8 oz (74.3 kg)  02/27/22 170 lb (77.1 kg)   BP Readings from Last 3 Encounters:  05/03/22 124/80  03/29/22 120/70  02/27/22 128/80    General appearance: alert, no distress, well developed, well nourished      Assessment: Encounter Diagnoses  Name Primary?   Diabetes mellitus without complication (HCC) Yes   Body mass index (BMI) 31.0-31.9, adult    Hyperlipidemia associated with type 2 diabetes mellitus (HCC)    Vitamin D deficiency       Plan: Diabetes  Hgba1c 9.7 % last visit Continue glucose monitoring. Since insurance wont cover Mounjaro, change to Ozempic.  I called and spoke to  pharmacy.  Trulicity may be the preferred, so we will await pharmacy call back on Ozempic trial.   Continue metformin.  Obesity - continue efforts with healthy eating habits, medication Ozempic, exercise.  Hyperlipidemia-continue atorvastatin 10 mg daily   Traci Young was seen today for other.  Diagnoses and all orders for this visit:  Diabetes mellitus without complication (HCC)  Body mass index (BMI) 31.0-31.9, adult  Hyperlipidemia associated with type 2 diabetes mellitus (HCC)  Vitamin D deficiency  Other orders -     Semaglutide,0.25 or 0.5MG /DOS, (OZEMPIC, 0.25 OR 0.5 MG/DOSE,) 2 MG/3ML SOPN; Inject 0.25 mg into the skin once a week.    Follow up: call report 1 mo

## 2022-05-17 LAB — HM DIABETES EYE EXAM

## 2022-05-19 ENCOUNTER — Encounter: Payer: Self-pay | Admitting: Medical

## 2022-07-05 ENCOUNTER — Ambulatory Visit (INDEPENDENT_AMBULATORY_CARE_PROVIDER_SITE_OTHER): Payer: Commercial Managed Care - HMO | Admitting: Medical

## 2022-07-05 ENCOUNTER — Encounter: Payer: Self-pay | Admitting: Medical

## 2022-07-05 VITALS — BP 126/82 | HR 92 | Temp 97.7°F | Resp 16 | Wt 152.2 lb

## 2022-07-05 DIAGNOSIS — E1169 Type 2 diabetes mellitus with other specified complication: Secondary | ICD-10-CM | POA: Diagnosis not present

## 2022-07-05 DIAGNOSIS — R200 Anesthesia of skin: Secondary | ICD-10-CM

## 2022-07-05 DIAGNOSIS — E119 Type 2 diabetes mellitus without complications: Secondary | ICD-10-CM

## 2022-07-05 DIAGNOSIS — E785 Hyperlipidemia, unspecified: Secondary | ICD-10-CM

## 2022-07-05 DIAGNOSIS — E559 Vitamin D deficiency, unspecified: Secondary | ICD-10-CM | POA: Diagnosis not present

## 2022-07-05 DIAGNOSIS — R202 Paresthesia of skin: Secondary | ICD-10-CM

## 2022-07-05 DIAGNOSIS — Z1231 Encounter for screening mammogram for malignant neoplasm of breast: Secondary | ICD-10-CM | POA: Insufficient documentation

## 2022-07-05 LAB — POCT GLYCOSYLATED HEMOGLOBIN (HGB A1C): Hemoglobin A1C: 6.4 % — AB (ref 4.0–5.6)

## 2022-07-05 NOTE — Progress Notes (Signed)
Subjective:  Traci Young is a 62 y.o. female who presents for Chief Complaint  Patient presents with   Follow-up    Medication recheck.      Here for med check, accompanied by [facility] health interpreter for Spanish  Diabetes-she is using freestyle libre but is only checking periodically so her charts and graphs do not show adequate data.  She notes her blood sugars are running in the 90s to low 100s fasting in the morning.  She has made significant diet changes and for like she is doing pretty good.  She is taking metformin 1000 mg twice daily.  In recent last few months we have prescribed Mounjaro which was not covered by insurance, followed by Ozempic which was also not covered by insurance, followed by Bydureon which was covered by insurance but was still going to call $600 per month.  So she is not taking that.  Hyperlipidemia-compliant with Lipitor without complaint  Vitamin D deficiency-compliant with vitamin D supplement  She has had some numbness in the left hand second through fifth fingers mainly at night when she sleeps sometimes.  No daytime numbness.  No trauma injury or weakness.  No neck pain.  No upper arm pain.    No other aggravating or relieving factors.    No other c/o.  Past Medical History:  Diagnosis Date   CHEST PAIN 03/15/2009   Qualifier: Diagnosis of  By: Caryl Comes, MD, Remus Blake    Diabetes mellitus, new onset ([place]) 05/19/2019   Hgb A1c >14%   Elevated lipids    no meds, diet  controlled   Gum inflammation 04/05/2021   HYPERCHOLESTEROLEMIA 03/15/2009   Qualifier: Diagnosis of  By: Caryl Comes, MD, Remus Blake    Obesity    Osteopenia determined by x-ray 09/01/2020   OVERWEIGHT 03/15/2009   Qualifier: Diagnosis of  By: Caryl Comes, MD, Remus Blake    SVD (spontaneous vaginal delivery)    x 4   Vitamin deficiency 04/05/2021   Current Outpatient Medications on File Prior to Visit  Medication Sig Dispense Refill   atorvastatin  (LIPITOR) 10 MG tablet Take 1 tablet (10 mg total) by mouth in the morning. 90 tablet 3   cholecalciferol (VITAMIN D3) 25 MCG (1000 UNIT) tablet Take 1 tablet (1,000 Units total) by mouth daily. 90 tablet 3   metFORMIN (GLUCOPHAGE) 1000 MG tablet Take 1 tablet (1,000 mg total) by mouth 2 (two) times daily with a meal. 180 tablet 1   Continuous Blood Gluc Receiver (FREESTYLE LIBRE 14 DAY READER) DEVI Use for libre 3 sensor (Patient not taking: Reported on 07/05/2022) 1 each 0   Continuous Blood Gluc Sensor (FREESTYLE LIBRE 3 SENSOR) MISC 1 each by Does not apply route every 14 (fourteen) days. Place 1 sensor on the skin every 14 days. Use to check glucose continuously (Patient not taking: Reported on 07/05/2022) 2 each 11   No current facility-administered medications on file prior to visit.    The following portions of the patient's history were reviewed and updated as appropriate: allergies, current medications, past family history, past medical history, past social history, past surgical history and problem list.  ROS Otherwise as in subjective above    Objective: BP 126/82   Pulse 92   Temp 97.7 F (36.5 C) (Tympanic)   Resp 16   Wt 152 lb 3.2 oz (69 kg)   LMP 06/30/2015   SpO2 96% Comment: room air  BMI 27.39 kg/m   Wt Readings from  Last 3 Encounters:  07/05/22 152 lb 3.2 oz (69 kg)  05/03/22 157 lb 9.6 oz (71.5 kg)  03/29/22 163 lb 12.8 oz (74.3 kg)   BP Readings from Last 3 Encounters:  07/05/22 126/82  05/03/22 124/80  03/29/22 120/70   General appearance: alert, no distress, well developed, well nourished Otherwise not examined    Assessment: Encounter Diagnoses  Name Primary?   Diabetes mellitus without complication ([place]) Yes   Encounter for screening mammogram for malignant neoplasm of breast    Hyperlipidemia associated with type 2 diabetes mellitus ([place])    Vitamin D deficiency    Numbness and tingling in left hand      Plan: Diabetes  Hgba1c at goal  today.  Continue Metformin 1060m BID, need to check app for freestyle at least 3 times daily as she is not checking enough for data collection currently Continue eye doctor visits year, daily foot checks  Hyperlipidemia-continue atorvastatin 10 mg daily  Numbness of arm - likely due to positioning in her sleep. Avoid direct pressure on the arm, use pillows to help pad the arm  Vit D deficiency - continue vitamin D supplement   MDasiyahwas seen today for follow-up.  Diagnoses and all orders for this visit:  Diabetes mellitus without complication ([place] -     POCT glycosylated hemoglobin (Hb A1C)  Encounter for screening mammogram for malignant neoplasm of breast -     MM DIGITAL SCREENING BILATERAL; Future  Hyperlipidemia associated with type 2 diabetes mellitus (HCC)  Vitamin D deficiency  Numbness and tingling in left hand  Spent > 30 minutes face to face with patient in discussion of symptoms, evaluation, plan and recommendations.    Follow up: call report 1 mo

## 2022-08-22 ENCOUNTER — Ambulatory Visit
Admission: RE | Admit: 2022-08-22 | Discharge: 2022-08-22 | Disposition: A | Discharge status: Home / Self Care | Payer: Commercial Managed Care - HMO | Source: Ambulatory Visit | Attending: Medical | Admitting: Medical

## 2022-08-22 DIAGNOSIS — Z1231 Encounter for screening mammogram for malignant neoplasm of breast: Secondary | ICD-10-CM

## 2022-08-23 NOTE — Progress Notes (Signed)
Results sent through MyChart

## 2022-11-08 ENCOUNTER — Ambulatory Visit (INDEPENDENT_AMBULATORY_CARE_PROVIDER_SITE_OTHER): Payer: Commercial Managed Care - HMO | Admitting: Medical

## 2022-11-08 VITALS — BP 124/84 | HR 84 | Wt 165.0 lb

## 2022-11-08 DIAGNOSIS — M8580 Other specified disorders of bone density and structure, unspecified site: Secondary | ICD-10-CM | POA: Diagnosis not present

## 2022-11-08 DIAGNOSIS — E559 Vitamin D deficiency, unspecified: Secondary | ICD-10-CM | POA: Diagnosis not present

## 2022-11-08 DIAGNOSIS — E119 Type 2 diabetes mellitus without complications: Secondary | ICD-10-CM | POA: Diagnosis not present

## 2022-11-08 DIAGNOSIS — E785 Hyperlipidemia, unspecified: Secondary | ICD-10-CM

## 2022-11-08 DIAGNOSIS — E1169 Type 2 diabetes mellitus with other specified complication: Secondary | ICD-10-CM

## 2022-11-08 DIAGNOSIS — Z758 Other problems related to medical facilities and other health care: Secondary | ICD-10-CM

## 2022-11-08 DIAGNOSIS — Z603 Acculturation difficulty: Secondary | ICD-10-CM

## 2022-11-08 MED ORDER — OZEMPIC (0.25 OR 0.5 MG/DOSE) 2 MG/3ML ~~LOC~~ SOPN: 0.2500 mg | PEN_INJECTOR | SUBCUTANEOUS | 1 refills | Status: DC

## 2022-11-08 NOTE — Progress Notes (Signed)
Subjective:  Traci Young is a 62 y.o. female who presents for Chief Complaint  Patient presents with   Follow-up    6 month f/u, no other issues, did gain some weight though,      Here for med check  Exercise with walking regularly.  Checking blood sugars regularly within glucometer.  She needs refills of medicines and testing supplies.  Blood sugars are running on between 100-130 fasting  Compliant with metformin but would like to go on weekly injectable for weight loss and sugar  Hyperlipidemia-compliant with Lipitor without complaint  Vitamin D deficiency-compliant with vitamin D supplement  She has had some numbness in the left hand second through fifth fingers mainly at night when she sleeps sometimes.  No daytime numbness.  No trauma injury or weakness.  No neck pain.  No upper arm pain.    No other aggravating or relieving factors.    No other c/o.  Past Medical History:  Diagnosis Date   CHEST PAIN 03/15/2009   Qualifier: Diagnosis of  By: Graciela Husbands, MD, Susie Cassette    Diabetes mellitus, new onset (HCC) 05/19/2019   Hgb A1c >14%   Elevated lipids    no meds, diet  controlled   Gum inflammation 04/05/2021   HYPERCHOLESTEROLEMIA 03/15/2009   Qualifier: Diagnosis of  By: Graciela Husbands, MD, Susie Cassette    Obesity    Osteopenia determined by x-ray 09/01/2020   OVERWEIGHT 03/15/2009   Qualifier: Diagnosis of  By: Graciela Husbands, MD, Susie Cassette    SVD (spontaneous vaginal delivery)    x 4   Vitamin deficiency 04/05/2021   Current Outpatient Medications on File Prior to Visit  Medication Sig Dispense Refill   atorvastatin (LIPITOR) 10 MG tablet Take 1 tablet (10 mg total) by mouth in the morning. 90 tablet 3   cholecalciferol (VITAMIN D3) 25 MCG (1000 UNIT) tablet Take 1 tablet (1,000 Units total) by mouth daily. 90 tablet 3   metFORMIN (GLUCOPHAGE) 1000 MG tablet Take 1 tablet (1,000 mg total) by mouth 2 (two) times daily with a meal. 180 tablet  1   Continuous Blood Gluc Receiver (FREESTYLE LIBRE 14 DAY READER) DEVI Use for libre 3 sensor (Patient not taking: Reported on 07/05/2022) 1 each 0   Continuous Blood Gluc Sensor (FREESTYLE LIBRE 3 SENSOR) MISC 1 each by Does not apply route every 14 (fourteen) days. Place 1 sensor on the skin every 14 days. Use to check glucose continuously (Patient not taking: Reported on 07/05/2022) 2 each 11   No current facility-administered medications on file prior to visit.    The following portions of the patient's history were reviewed and updated as appropriate: allergies, current medications, past family history, past medical history, past social history, past surgical history and problem list.  ROS Otherwise as in subjective above    Objective: BP 124/84   Pulse 84   Wt 165 lb (74.8 kg)   LMP 06/30/2015   BMI 29.70 kg/m   Wt Readings from Last 3 Encounters:  11/08/22 165 lb (74.8 kg)  07/05/22 152 lb 3.2 oz (69 kg)  05/03/22 157 lb 9.6 oz (71.5 kg)   BP Readings from Last 3 Encounters:  11/08/22 124/84  07/05/22 126/82  05/03/22 124/80   General appearence: alert, no distress, WD/WN,  Neck: supple, no lymphadenopathy, no thyromegaly, no masses Heart: RRR, normal S1, S2, no murmurs Lungs: CTA bilaterally, no wheezes, rhonchi, or rales Abdomen: +bs, soft, non tender, non distended, no masses, no  hepatomegaly, no splenomegaly Pulses: 2+ symmetric, upper and lower extremities, normal cap refill  Diabetic Foot Exam - Simple   Simple Foot Form Diabetic Foot exam was performed with the following findings: Yes 11/08/2022  2:22 PM  Visual Inspection See comments: Yes Sensation Testing Intact to touch and monofilament testing bilaterally: Yes Pulse Check Posterior Tibialis and Dorsalis pulse intact bilaterally: Yes Comments Hammer toes bilat 2-5th toes       Assessment: Encounter Diagnoses  Name Primary?   Diabetes mellitus without complication (HCC) Yes   Hyperlipidemia  associated with type 2 diabetes mellitus (HCC)    Osteopenia determined by x-ray    Vitamin D deficiency    Language barrier      Plan: Diabetes  Hgba1c at goal today.  Continue Metformin 1000mg  BID Add ozempic to help with sugar control, weight.  Discussed risks/benefits of medication Continue eye doctor visits year, daily foot checks  Hyperlipidemia-continue atorvastatin 10 mg daily, labs today  Vit D deficiency - continue vitamin D supplement, labs today   Devaeh was seen today for follow-up.  Diagnoses and all orders for this visit:  Diabetes mellitus without complication (HCC) -     Microalbumin/Creatinine Ratio, Urine -     Hemoglobin A1c -     CBC -     Comprehensive metabolic panel  Hyperlipidemia associated with type 2 diabetes mellitus (HCC) -     Lipid panel -     CBC -     Comprehensive metabolic panel  Osteopenia determined by x-ray -     CBC  Vitamin D deficiency -     VITAMIN D 25 Hydroxy (Vit-D Deficiency, Fractures)  Language barrier  Other orders -     Semaglutide,0.25 or 0.5MG /DOS, (OZEMPIC, 0.25 OR 0.5 MG/DOSE,) 2 MG/3ML SOPN; Inject 0.25 mg into the skin once a week.   Follow up: pending labs

## 2022-11-09 ENCOUNTER — Other Ambulatory Visit: Payer: Self-pay | Admitting: Medical

## 2022-11-09 DIAGNOSIS — E1169 Type 2 diabetes mellitus with other specified complication: Secondary | ICD-10-CM

## 2022-11-09 LAB — LIPID PANEL
Chol/HDL Ratio: 3.8 ratio (ref 0.0–4.4)
Cholesterol, Total: 253 mg/dL — ABNORMAL HIGH (ref 100–199)
HDL: 67 mg/dL (ref >39)
LDL Chol Calc (NIH): 140 mg/dL — ABNORMAL HIGH (ref 0–99)
Triglycerides: 259 mg/dL — ABNORMAL HIGH (ref 0–149)
VLDL Cholesterol Cal: 46 mg/dL — ABNORMAL HIGH (ref 5–40)

## 2022-11-09 LAB — COMPREHENSIVE METABOLIC PANEL
ALT: 15 IU/L (ref 0–32)
AST: 18 IU/L (ref 0–40)
Albumin: 4.8 g/dL (ref 3.8–4.9)
Alkaline Phosphatase: 95 IU/L (ref 44–121)
BUN/Creatinine Ratio: 45 — ABNORMAL HIGH (ref 12–28)
BUN: 22 mg/dL (ref 8–27)
Bilirubin Total: 0.2 mg/dL (ref 0.0–1.2)
CO2: 19 mmol/L — ABNORMAL LOW (ref 20–29)
Calcium: 9.7 mg/dL (ref 8.7–10.3)
Chloride: 101 mmol/L (ref 96–106)
Creatinine, Ser: 0.49 mg/dL — ABNORMAL LOW (ref 0.57–1.00)
Globulin, Total: 2.2 g/dL (ref 1.5–4.5)
Glucose: 95 mg/dL (ref 70–99)
Potassium: 4.2 mmol/L (ref 3.5–5.2)
Sodium: 138 mmol/L (ref 134–144)
Total Protein: 7 g/dL (ref 6.0–8.5)
eGFR: 108 mL/min/{1.73_m2} (ref >59)

## 2022-11-09 LAB — CBC
Hematocrit: 41 % (ref 34.0–46.6)
Hemoglobin: 14.2 g/dL (ref 11.1–15.9)
MCH: 29.6 pg (ref 26.6–33.0)
MCHC: 34.6 g/dL (ref 31.5–35.7)
MCV: 86 fL (ref 79–97)
Platelets: 287 10*3/uL (ref 150–450)
RBC: 4.79 x10E6/uL (ref 3.77–5.28)
RDW: 14 % (ref 11.7–15.4)
WBC: 6.4 10*3/uL (ref 3.4–10.8)

## 2022-11-09 LAB — MICROALBUMIN / CREATININE URINE RATIO
Creatinine, Urine: 13.2 mg/dL
Microalb/Creat Ratio: <23 mg/g creat (ref 0–29)
Microalbumin, Urine: <3 ug/mL

## 2022-11-09 LAB — HEMOGLOBIN A1C
Est. average glucose Bld gHb Est-mCnc: 128 mg/dL
Hgb A1c MFr Bld: 6.1 % — ABNORMAL HIGH (ref 4.8–5.6)

## 2022-11-09 LAB — VITAMIN D 25 HYDROXY (VIT D DEFICIENCY, FRACTURES): Vit D, 25-Hydroxy: 25.6 ng/mL — ABNORMAL LOW (ref 30.0–100.0)

## 2022-11-09 MED ORDER — VITAMIN D (ERGOCALCIFEROL) 1.25 MG (50000 UNIT) PO CAPS: 50000.0000 [IU] | ORAL_CAPSULE | ORAL | 3 refills | Status: DC

## 2022-11-09 MED ORDER — ATORVASTATIN CALCIUM 20 MG PO TABS: 20.0000 mg | ORAL_TABLET | Freq: Every day | ORAL | 3 refills | Status: DC

## 2022-11-09 MED ORDER — FENOFIBRATE 145 MG PO TABS: 145.0000 mg | ORAL_TABLET | Freq: Every day | ORAL | 0 refills | Status: DC

## 2022-11-09 MED ORDER — METFORMIN HCL 1000 MG PO TABS: 1000.0000 mg | ORAL_TABLET | Freq: Two times a day (BID) | ORAL | 0 refills | Status: DC

## 2022-11-09 NOTE — Progress Notes (Signed)
Results sent through MyChart

## 2022-11-10 NOTE — Progress Notes (Signed)
Results sent through MyChart

## 2022-11-16 ENCOUNTER — Other Ambulatory Visit: Payer: Self-pay | Admitting: Medical

## 2022-11-16 DIAGNOSIS — E1169 Type 2 diabetes mellitus with other specified complication: Secondary | ICD-10-CM

## 2023-01-18 LAB — LAB REPORT - SCANNED
A1c: 5.8
EGFR (Non-African Amer.): 92

## 2023-04-02 ENCOUNTER — Other Ambulatory Visit: Payer: Self-pay | Admitting: Medical

## 2023-04-02 DIAGNOSIS — E782 Mixed hyperlipidemia: Secondary | ICD-10-CM

## 2023-04-02 DIAGNOSIS — E785 Hyperlipidemia, unspecified: Secondary | ICD-10-CM

## 2023-04-02 NOTE — Telephone Encounter (Signed)
Dose was increased

## 2023-04-18 ENCOUNTER — Ambulatory Visit (INDEPENDENT_AMBULATORY_CARE_PROVIDER_SITE_OTHER): Payer: Commercial Managed Care - HMO | Admitting: Medical

## 2023-04-18 VITALS — BP 120/82 | HR 75 | Wt 158.0 lb

## 2023-04-18 DIAGNOSIS — R202 Paresthesia of skin: Secondary | ICD-10-CM

## 2023-04-18 DIAGNOSIS — Z758 Other problems related to medical facilities and other health care: Secondary | ICD-10-CM

## 2023-04-18 DIAGNOSIS — G5603 Carpal tunnel syndrome, bilateral upper limbs: Secondary | ICD-10-CM

## 2023-04-18 DIAGNOSIS — Z23 Encounter for immunization: Secondary | ICD-10-CM

## 2023-04-18 DIAGNOSIS — Z603 Acculturation difficulty: Secondary | ICD-10-CM | POA: Diagnosis not present

## 2023-04-18 DIAGNOSIS — E559 Vitamin D deficiency, unspecified: Secondary | ICD-10-CM

## 2023-04-18 DIAGNOSIS — E1169 Type 2 diabetes mellitus with other specified complication: Secondary | ICD-10-CM | POA: Diagnosis not present

## 2023-04-18 DIAGNOSIS — E785 Hyperlipidemia, unspecified: Secondary | ICD-10-CM

## 2023-04-18 DIAGNOSIS — E119 Type 2 diabetes mellitus without complications: Secondary | ICD-10-CM

## 2023-04-18 NOTE — Progress Notes (Signed)
Subjective:  Traci Young is a 62 y.o. female who presents for Chief Complaint  Patient presents with   Medical Management of Chronic Issues    Med check- numbness of hands been going on a while. Going to [facility] and getting [facility] injections  Semaglutide 8mg , had an appt at 3pm today to get another 8mg .    Here for med check.  Accompanied by interpreter Ladell Pier  Diabetes-compliant metformin 1000 mg twice daily.  Doing well, eating healthy, exercising.  No recent issues.  She apparently is getting semaglutide for $30 per week at a different clinic.  The same medicine was going to be too expensive through prescription.   she will ask the clinic to send [place] a copy of the notes since she does not remember the name of the clinic  Hyperlipidemia-compliant with Lipitor but not compliant with fenofibrate.  She did know about the fenofibrate.  She did not get results from her last visit.  She does not speak [place] and apparently did not realize we have sent results by MyChart  Vitamin D deficiency-compliant with vitamin D supplement  Lately she is having problems with numbness in both hands.  She cleans houses for living.  She denies injury or trauma.  She does not drink alcohol regularly.  No history of B12 deficiency.  No other aggravating or relieving factors.    No other c/o.  Past Medical History:  Diagnosis Date   CHEST PAIN 03/15/2009   Qualifier: Diagnosis of  By: Graciela Husbands, MD, [facility], Ty Hilts    Diabetes mellitus, new onset (HCC) 05/19/2019   Hgb A1c >14%   Elevated lipids    no meds, diet  controlled   Gum inflammation 04/05/2021   HYPERCHOLESTEROLEMIA 03/15/2009   Qualifier: Diagnosis of  By: Graciela Husbands, MD, [facility]    Obesity    Osteopenia determined by x-ray 09/01/2020   OVERWEIGHT 03/15/2009   Qualifier: Diagnosis of  By: Graciela Husbands, MD, Susie Cassette    SVD (spontaneous vaginal delivery)    x 4   Vitamin deficiency 04/05/2021    Current Outpatient Medications on File Prior to Visit  Medication Sig Dispense Refill   atorvastatin (LIPITOR) 20 MG tablet Take 1 tablet (20 mg total) by mouth daily. 90 tablet 3   metFORMIN (GLUCOPHAGE) 1000 MG tablet TAKE 1 TABLET(1000 MG) BY MOUTH TWICE DAILY WITH A MEAL 180 tablet 1   Vitamin D, Ergocalciferol, (DRISDOL) 1.25 MG (50000 UNIT) CAPS capsule Take 1 capsule (50,000 Units total) by mouth every 7 (seven) days. 12 capsule 3   fenofibrate (TRICOR) 145 MG tablet Take 1 tablet (145 mg total) by mouth daily. (Patient not taking: Reported on 04/18/2023) 90 tablet 0   No current facility-administered medications on file prior to visit.     The following portions of the patient's history were reviewed and updated as appropriate: allergies, current medications, past family history, past medical history, past social history, past surgical history and problem list.  ROS Otherwise as in subjective above  Objective: BP 120/82   Pulse 75   Wt 158 lb (71.7 kg)   LMP 06/30/2015   BMI 28.44 kg/m   General appearance: alert, no distress, well developed, well nourished HEENT: normocephalic, sclerae anicteric, conjunctiva pink and moist, TMs pearly, nares patent, no discharge or erythema, pharynx normal Oral cavity: MMM, no lesions Neck: supple, no lymphadenopathy, no thyromegaly, no masses Heart: RRR, normal S1, S2, no murmurs Lungs: CTA bilaterally, no wheezes, rhonchi, or rales  Abdomen: +bs, soft, non tender, non distended, no masses, no hepatomegaly, no splenomegaly Pulses: 2+ radial pulses, 2+ pedal pulses, normal cap refill Ext: no edema   Assessment: Encounter Diagnoses  Name Primary?   Hyperlipidemia associated with type 2 diabetes mellitus (HCC) Yes   Needs flu shot    Vitamin D deficiency    Language barrier    Diabetes mellitus without complication (HCC)    Paresthesia    Bilateral carpal tunnel syndrome      Plan: Hyperlipidemia-last visit she did not get  the information about recommendations on fenofibrate but is taking the atorvastatin 20 mg daily.  We discussed using MyChart and having her daughter who reads English to help review the recommendations and reply when needed.  Continue statin, update labs today with likely recommendation to go on fenofibrate to help with triglycerides.  Counseled on the influenza virus vaccine.  Vaccine information sheet given.  Influenza vaccine given after consent obtained.  Vitamin D deficiency-continue vitamin D supplement, updated labs today  Diabetes-continue metformin 1000 mg twice daily.  Updated labs today.  Continue healthy diet and exercise, glucose monitoring  Paresthesia of hands, likely carpal tunnel syndrome.   Discussed their symptoms and exam findings suggestive of carpal tunnel syndrome of both hand.  discussed possible complications, symptoms, treatment options.  Begin trial of night time reinforced wrist splint.  Can use short term bag of frozen peas for ice at a time.   Discussed other possible evaluation including EMG/NCS or other referral.   F/u in 3-4 weeks for recheck.      [name] was seen today for medical management of chronic issues.  Diagnoses and all orders for this visit:  Hyperlipidemia associated with type 2 diabetes mellitus (HCC) -     Lipid panel -     Comprehensive metabolic panel  Needs flu shot -     Flu vaccine trivalent PF, 6mos and older(Flulaval,Afluria,Fluarix,Fluzone)  Vitamin D deficiency -     VITAMIN D 25 Hydroxy (Vit-D Deficiency, Fractures)  Language barrier  Diabetes mellitus without complication (HCC) -     Hemoglobin A1c  Paresthesia -     Vitamin B12  Bilateral carpal tunnel syndrome    Follow up: pending labs

## 2023-04-19 ENCOUNTER — Other Ambulatory Visit: Payer: Self-pay | Admitting: Medical

## 2023-04-19 DIAGNOSIS — E1169 Type 2 diabetes mellitus with other specified complication: Secondary | ICD-10-CM

## 2023-04-19 LAB — LIPID PANEL
Cholesterol, Total: 195 mg/dL (ref 100–199)
HDL: 61 mg/dL (ref >39)
LDL CALC COMMENT:: 3.2 ratio (ref 0.0–4.4)
LDL Chol Calc (NIH): 103 mg/dL — ABNORMAL HIGH (ref 0–99)
Triglycerides: 180 mg/dL — ABNORMAL HIGH (ref 0–149)
VLDL Cholesterol Cal: 31 mg/dL (ref 5–40)

## 2023-04-19 LAB — HEMOGLOBIN A1C
Est. average glucose Bld gHb Est-mCnc: 114 mg/dL
Hgb A1c MFr Bld: 5.6 % (ref 4.8–5.6)

## 2023-04-19 LAB — COMPREHENSIVE METABOLIC PANEL
ALT: 20 IU/L (ref 0–32)
AST: 23 IU/L (ref 0–40)
Albumin: 4.7 g/dL (ref 3.9–4.9)
Alkaline Phosphatase: 83 IU/L (ref 44–121)
BUN/Creatinine Ratio: 30 — ABNORMAL HIGH (ref 12–28)
BUN: 16 mg/dL (ref 8–27)
CO2: 15 mmol/L — ABNORMAL LOW (ref 20–29)
Calcium: 10 mg/dL (ref 8.7–10.3)
Chloride: 103 mmol/L (ref 96–106)
Creatinine, Ser: 0.54 mg/dL — ABNORMAL LOW (ref 0.57–1.00)
Globulin, Total: 2.8 g/dL (ref 1.5–4.5)
Glucose: 73 mg/dL (ref 70–99)
Potassium: 4.3 mmol/L (ref 3.5–5.2)
Sodium: 141 mmol/L (ref 134–144)
Total Protein: 7.5 g/dL (ref 6.0–8.5)
eGFR: 105 mL/min/{1.73_m2} (ref >59)

## 2023-04-19 LAB — VITAMIN D 25 HYDROXY (VIT D DEFICIENCY, FRACTURES): Vit D, 25-Hydroxy: 46.3 ng/mL (ref 30.0–100.0)

## 2023-04-19 LAB — VITAMIN B12: Vitamin B-12: 624 pg/mL (ref 232–1245)

## 2023-04-19 MED ORDER — ATORVASTATIN CALCIUM 40 MG PO TABS: 40.0000 mg | ORAL_TABLET | Freq: Every day | ORAL | 2 refills | Status: DC

## 2023-04-19 MED ORDER — METFORMIN HCL 1000 MG PO TABS: 1000.0000 mg | ORAL_TABLET | Freq: Two times a day (BID) | ORAL | 2 refills | Status: DC

## 2023-04-19 MED ORDER — VITAMIN D (ERGOCALCIFEROL) 1.25 MG (50000 UNIT) PO CAPS
50000.0000 [IU] | ORAL_CAPSULE | ORAL | 3 refills | Status: DC
Start: 2023-04-19 — End: 2023-12-21

## 2023-04-19 NOTE — Progress Notes (Signed)
Results sent through MyChart

## 2023-09-17 ENCOUNTER — Other Ambulatory Visit: Payer: Self-pay | Admitting: Medical

## 2023-09-17 ENCOUNTER — Ambulatory Visit: Payer: Self-pay

## 2023-09-17 ENCOUNTER — Ambulatory Visit: Admitting: Family Medicine

## 2023-09-17 ENCOUNTER — Encounter: Payer: Self-pay | Admitting: Family Medicine

## 2023-09-17 VITALS — BP 108/68 | HR 76 | Ht 62.0 in | Wt 157.8 lb

## 2023-09-17 DIAGNOSIS — M79672 Pain in left foot: Secondary | ICD-10-CM | POA: Diagnosis not present

## 2023-09-17 DIAGNOSIS — M545 Low back pain, unspecified: Secondary | ICD-10-CM | POA: Diagnosis not present

## 2023-09-17 LAB — POCT URINALYSIS DIP (PROADVANTAGE DEVICE)
Bilirubin, UA: NEGATIVE
Blood, UA: NEGATIVE
Glucose, UA: NEGATIVE mg/dL
Ketones, POC UA: NEGATIVE mg/dL
Leukocytes, UA: NEGATIVE
Nitrite, UA: NEGATIVE
Protein Ur, POC: NEGATIVE mg/dL
Specific Gravity, Urine: 1.005
Urobilinogen, Ur: 0.2
pH, UA: 6 (ref 5.0–8.0)

## 2023-09-17 MED ORDER — MELOXICAM 15 MG PO TABS: 15.0000 mg | ORAL_TABLET | Freq: Every day | ORAL | 0 refills | Status: DC

## 2023-09-17 NOTE — Progress Notes (Signed)
 Chief Complaint  Patient presents with   Pain    Friday 09/14/23 she was mopping kitchen floor, she fell and her left foot hit the chair and is very bruised, swollen and painful. She fell on her back, pain is worse today in her lower back more on right side.    5/2 she slipped on wet floor. She hit her left foot into a chair, and fell onto her back. Has pain at the right lower back, and left foot. Didn't hit head or lose consciousness.  Took ibuprofen 600 mg today, and 600 mg bid on Sat and Sun.  Sat/Sun used ice, not much help. There is slight radiation from LB into the right buttock/thigh. No numbness or tingling, no weakness. No bladder/bowel problems.   PMH, PSH, SH reviewed DM, HLD, vit D deficiency.  Outpatient Encounter Medications as of 09/17/2023  Medication Sig Note   atorvastatin  (LIPITOR) 40 MG tablet Take 1 tablet (40 mg total) by mouth daily. (Patient not taking: Reported on 09/17/2023) 09/17/2023: Ran out last week   ibuprofen (ADVIL) 200 MG tablet Take 600 mg by mouth every 6 (six) hours as needed. 09/17/2023: Last dose 9am   metFORMIN  (GLUCOPHAGE ) 1000 MG tablet Take 1 tablet (1,000 mg total) by mouth 2 (two) times daily with a meal.    Vitamin D , Ergocalciferol , (DRISDOL ) 1.25 MG (50000 UNIT) CAPS capsule Take 1 capsule (50,000 Units total) by mouth every 7 (seven) days.    No facility-administered encounter medications on file as of 09/17/2023.   No Known Allergies  ROS: no f/c, HA, URI or allergy symptoms. No numbness, tingling, weakness, bowel/bladder problems, no dysuria. No bleeding. +bruising on foot. No rashes.    PHYSICAL EXAM:  BP 108/68   Pulse 76   Ht 5\' 2"  (1.575 m)   Wt 157 lb 12.8 oz (71.6 kg)   LMP 06/30/2015   BMI 28.86 kg/m   Pleasant female in no acute distress.  Neck: no lymphadenopathy or mass Heart: regular rate and rhythm Lungs: clear bilaterally Back: no spinal or CVA tenderness. Tender at R SI joint. No muscle spasm. Extremities: no  edema, normal pulses. STS and bruising across the bottom of the 3-5th toes. Tender at left 3-5th toes, and 3rd and 4th metatarsals Psych: normal mood, affect, hygiene and grooming Neuro: alert and oriented. Normal strength, sensation. Antalgic gait due to foot pain.   ASSESSMENT/PLAN:  Acute right-sided low back pain, unspecified whether sciatica present - NSAIDs, heat/ice, salonpas patch. check XR - Plan: POCT Urinalysis DIP (Proadvantage Device), DG Lumbar Spine Complete, meloxicam (MOBIC) 15 MG tablet, CANCELED: DG Lumbar Spine Complete  Left foot pain - check x-rays to eval for poss fracture. Ice/elevate - Plan: DG Foot Complete Left, meloxicam (MOBIC) 15 MG tablet, CANCELED: DG Foot Complete Left  Visit conducted in English/Spanish.  She understood recommendations and had no questions or need for interpreter.   Stop taking ibuprofen. Instead take meloxicam once daily with food. You may use tylenol  (any kind--regular, extra strength or arthritis) if needed for pain that isn't controlled by the meloxicam. Do not take any advil/motrin/ibuprofen, Aleve/naproxen, Goody or BC powder while taking the meloxicam.  You can use a SalonPas patch with lidocaine  to your back. Continue with ice or heat--whichever works better.  Go to [facility] for x-rays.

## 2023-09-17 NOTE — Telephone Encounter (Signed)
  Chief Complaint: severe left foot pain from a fall Friday Symptoms: bruising and swelling Frequency: since Friday Pertinent Negatives: Patient denies other sx Disposition: [] ED /[] Urgent Care (no appt availability in office) / [x] Appointment(In office/virtual)/ []  [place] [facility]/ [] Home Care/ [] Refused Recommended Disposition /[] [place] [place] [facility]/ []  Follow-up with PCP Additional Notes: appt today in office Copied from CRM [id number]. Topic: Clinical - Red Word Triage >> Sep 17, 2023  1:43 PM Everlene Hobby D wrote: Patient fell down and hurt her left foot Friday. Patient's son says it's currently swollen and walking on it makes her back hurt. Reason for Disposition  [1] SEVERE pain (e.g., excruciating, unable to do any normal activities) AND [2] not improved after 2 hours of pain medicine  Answer Assessment - Initial Assessment Questions 1. ONSET: "When did the pain start?"      Fell Friday  hit back  2. LOCATION: "Where is the pain located?"      Left foot top of foot  3. PAIN: "How bad is the pain?"    (Scale 1-10; or mild, moderate, severe)  - MILD (1-3): doesn't interfere with normal activities.   - MODERATE (4-7): interferes with normal activities (e.g., work or school) or awakens from sleep, limping.   - SEVERE (8-10): excruciating pain, unable to do any normal activities, unable to walk.      10 4. WORK OR EXERCISE: "Has there been any recent work or exercise that involved this part of the body?"      fall 5. CAUSE: "What do you think is causing the foot pain?"     Fall  6. OTHER SYMPTOMS: "Do you have any other symptoms?" (e.g., leg pain, rash, fever, numbness)     Swelling, bruising  Protocols used: Foot Pain-A-AH

## 2023-09-17 NOTE — Patient Instructions (Addendum)
 Call your pharmacy for refill of the atorvastatin  (there should be refills on file).   Stop taking ibuprofen. Instead take meloxicam once daily with food. You may use tylenol  (any kind--regular, extra strength or arthritis) if needed for pain that isn't controlled by the meloxicam. Do not take any advil/motrin/ibuprofen, Aleve/naproxen, Goody or BC powder while taking the meloxicam.  You can use a SalonPas patch with lidocaine  to your back. Continue with ice or heat--whichever works better.  Ice and elevate your foot.  Go to [facility] for x-rays.

## 2023-09-18 ENCOUNTER — Ambulatory Visit
Admission: RE | Admit: 2023-09-18 | Discharge: 2023-09-18 | Disposition: A | Discharge status: Home / Self Care | Source: Ambulatory Visit | Attending: Family Medicine | Admitting: Family Medicine

## 2023-09-18 DIAGNOSIS — M79672 Pain in left foot: Secondary | ICD-10-CM

## 2023-09-18 DIAGNOSIS — M545 Low back pain, unspecified: Secondary | ICD-10-CM

## 2023-09-18 NOTE — Telephone Encounter (Signed)
 Pt was rx 50,000 units

## 2023-12-21 ENCOUNTER — Ambulatory Visit: Admitting: Medical

## 2023-12-21 VITALS — BP 110/68 | HR 71 | Temp 97.1°F | Wt 157.4 lb

## 2023-12-21 DIAGNOSIS — Z758 Other problems related to medical facilities and other health care: Secondary | ICD-10-CM

## 2023-12-21 DIAGNOSIS — M7918 Myalgia, other site: Secondary | ICD-10-CM

## 2023-12-21 DIAGNOSIS — M25551 Pain in right hip: Secondary | ICD-10-CM | POA: Diagnosis not present

## 2023-12-21 DIAGNOSIS — E118 Type 2 diabetes mellitus with unspecified complications: Secondary | ICD-10-CM | POA: Diagnosis not present

## 2023-12-21 DIAGNOSIS — E785 Hyperlipidemia, unspecified: Secondary | ICD-10-CM

## 2023-12-21 DIAGNOSIS — E1169 Type 2 diabetes mellitus with other specified complication: Secondary | ICD-10-CM

## 2023-12-21 DIAGNOSIS — Z603 Acculturation difficulty: Secondary | ICD-10-CM

## 2023-12-21 DIAGNOSIS — E559 Vitamin D deficiency, unspecified: Secondary | ICD-10-CM

## 2023-12-21 DIAGNOSIS — H524 Presbyopia: Secondary | ICD-10-CM

## 2023-12-21 DIAGNOSIS — H5213 Myopia, bilateral: Secondary | ICD-10-CM

## 2023-12-21 DIAGNOSIS — H52203 Unspecified astigmatism, bilateral: Secondary | ICD-10-CM

## 2023-12-21 MED ORDER — VITAMIN D (ERGOCALCIFEROL) 1.25 MG (50000 UNIT) PO CAPS: 50000.0000 [IU] | ORAL_CAPSULE | ORAL | 1 refills | Status: DC

## 2023-12-21 MED ORDER — METFORMIN HCL 500 MG PO TABS: 500.0000 mg | ORAL_TABLET | Freq: Two times a day (BID) | ORAL | 1 refills | Status: DC

## 2023-12-21 MED ORDER — ATORVASTATIN CALCIUM 40 MG PO TABS
40.0000 mg | ORAL_TABLET | Freq: Every day | ORAL | 1 refills | Status: DC
Start: 2023-12-21 — End: 2023-12-24

## 2023-12-21 NOTE — Patient Instructions (Addendum)
 She will switch pharmacies today as she has active refills per our records.  So I am not sure exactly what was happening at her pharmacy.  Hyperlipidemia-restart atorvastatin  Lipitor 40 mg daily  Diabetes-given her home readings looking pretty good we will restart metformin  at a lower dose 500 mg twice daily.  Continue glucose testing.  I printed a list of eye doctors today so she can call to get set up with a new eye doctor.  She is due for yearly eye exam and diabetic eye exam  Vitamin D  deficiency-restart vitamin D  supplement  Her hip and buttock pain today could suggest sciatic inflammation, hip arthritis and possibly piriformis issue.  We will refer to physical therapy.  If not improving after the next 3 to 4 weeks consider sports medicine follow-up with Dr. Vita here.  Expect a phone call about the physical therapy referral  Ophthalmologists/Eye doctors [facility] [address] [place], [place], [place] [address] [phone]  [facility] [address] [place], [place] [address] [phone]  [facility] [address] [place], [place] [address] [phone]  [facility]  [address] [place], [place], [place] [address] [phone]  [facility] [address], [place], [place] [address] [phone]

## 2023-12-21 NOTE — Progress Notes (Signed)
 Subjective:  Traci Young is a 63 y.o. female who presents for Chief Complaint  Patient presents with   Acute Visit    Right hip discomfort that goes down leg, when bending she has pain, x 2 weeks Has been without medication for a couple months   Here for leg concerns and medications.  Her daughter is on the phone who interprets today.  Here for pains in right buttocks, and lateral and anterior right leg.  Pain x 2 weeks.  Although she has had some problems on and off since she came here in May for a similar issue. She has pain with right hip/leg ROM.  Sometimes gets pain down lower leg.  No numbness or tingling in the leg.  Lately on back pain.  She notes that her pharmacy does not have her medicines.  She was told to call the office and she said she called here but is still has not been straightened out.  She has been out of medication several months.  She was using [facility] but [facility]  She does check her blood sugars the morning sugars run around 120-130 fasting  She is exercising and active.  She has not seen the eye doctor in the last year.  She declines vaccines today.  No other aggravating or relieving factors.    No other c/o.  Past Medical History:  Diagnosis Date   CHEST PAIN 03/15/2009   Qualifier: Diagnosis of  By: Fernande, MD, [facility], Elspeth Darner    Diabetes mellitus, new onset (HCC) 05/19/2019   Hgb A1c >14%   Elevated lipids    no meds, diet  controlled   Gum inflammation 04/05/2021   HYPERCHOLESTEROLEMIA 03/15/2009   Qualifier: Diagnosis of  By: Fernande, MD, [facility] Elspeth Darner    Obesity    Osteopenia determined by x-ray 09/01/2020   OVERWEIGHT 03/15/2009   Qualifier: Diagnosis of  By: Fernande, MD, [facility] Elspeth Darner    SVD (spontaneous vaginal delivery)    x 4   Vitamin deficiency 04/05/2021   Current Outpatient Medications on File Prior to Visit  Medication Sig Dispense Refill   metFORMIN  (GLUCOPHAGE ) 1000 MG  tablet Take 1 tablet (1,000 mg total) by mouth 2 (two) times daily with a meal. (Patient not taking: Reported on 12/21/2023) 180 tablet 2   No current facility-administered medications on file prior to visit.     The following portions of the patient's history were reviewed and updated as appropriate: allergies, current medications, past family history, past medical history, past social history, past surgical history and problem list.  ROS Otherwise as in subjective above  Objective: BP 110/68   Pulse 71   Temp (!) 97.1 F (36.2 C)   Wt 157 lb 6.4 oz (71.4 kg)   LMP 06/30/2015   SpO2 98%   BMI 28.79 kg/m   General appearance: alert, no distress, well developed, well nourished Neck: supple, no lymphadenopathy, no thyromegaly, no masses Heart: RRR, normal S1, S2, no murmurs Lungs: CTA bilaterally, no wheezes, rhonchi, or rales Back nontender today with normal range of motion MSK: Tender to the right hip, tender right buttock in the area of the sciatic notch and piriformis area, otherwise leg nontender.  Range of motion seems normal and full with the right hip and leg. Legs with normal strength. Legs neurovascularly intact Pulses: 2+ radial pulses, 2+ pedal pulses, normal cap refill Ext: no edema   Assessment: Encounter Diagnoses  Name Primary?   Right  hip pain Yes   Right buttock pain    Type II diabetes mellitus with manifestations (HCC)    Hyperlipidemia associated with type 2 diabetes mellitus (HCC)    Language barrier    Myopia with astigmatism and presbyopia, bilateral    Vitamin D  deficiency       Plan: She will switch pharmacies today as she has active refills per our records.  So I am not sure exactly what was happening at her pharmacy.  Hyperlipidemia-restart atorvastatin  Lipitor 40 mg daily  Diabetes-given her home readings looking pretty good we will restart metformin  at a lower dose 500 mg twice daily.  Continue glucose testing.  I printed a list of eye  doctors today so she can call to get set up with a new eye doctor.  She is due for yearly eye exam and diabetic eye exam  Vitamin D  deficiency-restart vitamin D  supplement  Her hip and buttock pain today could suggest sciatic inflammation, hip arthritis and possibly piriformis issue.  We will refer to physical therapy.  If not improving after the next 3 to 4 weeks consider sports medicine follow-up with Dr. Vita here.    Traci Young was seen today for acute visit.  Diagnoses and all orders for this visit:  Right hip pain -     Ambulatory referral to Physical Therapy  Right buttock pain -     Ambulatory referral to Physical Therapy  Type II diabetes mellitus with manifestations (HCC) -     Hemoglobin A1c -     Microalbumin/Creatinine Ratio, Urine  Hyperlipidemia associated with type 2 diabetes mellitus (HCC)  Language barrier  Myopia with astigmatism and presbyopia, bilateral  Vitamin D  deficiency  Other orders -     atorvastatin  (LIPITOR) 40 MG tablet; Take 1 tablet (40 mg total) by mouth daily. -     Vitamin D , Ergocalciferol , (DRISDOL ) 1.25 MG (50000 UNIT) CAPS capsule; Take 1 capsule (50,000 Units total) by mouth every 7 (seven) days. -     metFORMIN  (GLUCOPHAGE ) 500 MG tablet; Take 1 tablet (500 mg total) by mouth 2 (two) times daily with a meal.     Follow up: pending labs

## 2023-12-22 LAB — MICROALBUMIN / CREATININE URINE RATIO
Creatinine, Urine: 12.1 mg/dL
Microalb/Creat Ratio: <25 mg/g{creat} (ref 0–29)
Microalbumin, Urine: <3 ug/mL

## 2023-12-22 LAB — HEMOGLOBIN A1C
Est. average glucose Bld gHb Est-mCnc: 117 mg/dL
Hgb A1c MFr Bld: 5.7 % — ABNORMAL HIGH (ref 4.8–5.6)

## 2023-12-24 ENCOUNTER — Other Ambulatory Visit: Payer: Self-pay | Admitting: Medical

## 2023-12-24 ENCOUNTER — Ambulatory Visit: Payer: Self-pay | Admitting: Medical

## 2023-12-24 MED ORDER — VITAMIN D (ERGOCALCIFEROL) 1.25 MG (50000 UNIT) PO CAPS: 50000.0000 [IU] | ORAL_CAPSULE | ORAL | 1 refills | Status: DC

## 2023-12-24 MED ORDER — ATORVASTATIN CALCIUM 40 MG PO TABS: 40.0000 mg | ORAL_TABLET | Freq: Every day | ORAL | 1 refills | Status: DC

## 2023-12-24 MED ORDER — METFORMIN HCL 500 MG PO TABS: 500.0000 mg | ORAL_TABLET | Freq: Two times a day (BID) | ORAL | 1 refills | Status: DC

## 2023-12-24 NOTE — Progress Notes (Signed)
 Results through MyChart

## 2023-12-24 NOTE — Telephone Encounter (Signed)
 Resent this in.  Copied from CRM [id number]. Topic: Clinical - Prescription Issue >> Dec 24, 2023 12:09 PM Deaijah H wrote: Reason for CRM: [facility] Metformin , Vit D. and atorvastatin 

## 2024-02-13 ENCOUNTER — Ambulatory Visit: Payer: Self-pay

## 2024-02-13 NOTE — Telephone Encounter (Signed)
 Pt has an appt tomorrow

## 2024-02-13 NOTE — Telephone Encounter (Signed)
 FYI Only or Action Required?: FYI only for provider.  Patient was last seen in primary care on 12/21/2023 by Bulah Alm RAMAN, PA-C.  Called Nurse Triage reporting Pain.  Symptoms began several days ago.  Interventions attempted: Rest, hydration, or home remedies.  Symptoms are: gradually worsening.  Triage Disposition: See Physician Within 24 Hours  Patient/caregiver understands and will follow disposition?: Yes  Copied from CRM #[id number]. Topic: Clinical - Red Word Triage >> Feb 13, 2024  3:16 PM Roselie BROCKS wrote: [facility] that prompted transfer to Nurse Triage: Patient has someone to speak for her( Mr. Zachary is in the room, patient states she is feeling very sick, pain all over body, found a tick on her chest 2 days ago, and afraid it may be something to do with that,she is now also seeing spots surrounding her arm, Reason for Disposition  [1] Muscle aches are unexplained AND [2] occur within 1 month of a tick bite  Answer Assessment - Initial Assessment Questions Pt son, Torrence Burnet is calling on behalf of pt. Not on DPR but pt is present and gives permission to discuss personal and medical information.  Pt began having flu like sxs last week. Admits to body aches/pains. Then two days ago found and removed tick from her chest. States she has red flushing across cheeks, and a rash to bilat arms that son states look like bad bug bites. Pt denies CP SOB Dizziness or any other sxs.   1. ONSET: When did the muscle aches or body pains start?      Last week w/ flu like sxs.  2. LOCATION: What part of your body is hurting? (e.g., entire body, arms, legs)      Hips to feet 3. SEVERITY: How bad is the pain? (Scale 1-10; or mild, moderate, severe)     6/10 worsens with movement or activity 4. CAUSE: What do you think is causing the pains?     Unsure but thought related to flu like sxs 5. FEVER: Do you have a fever? If Yes, ask: What is your temperature, how was it  measured, and  when did it start?      denies 6. OTHER SYMPTOMS: Do you have any other symptoms? (e.g., chest pain, cold or flu symptoms, rash, weakness, weight loss)     States she found and removed tick from her chest two days ago. Has flushing across cheeks and rash to bilat arms that look like bad bug bites  8. TRAVEL: Have you traveled out of the country in the last month? (e.g., exposures, travel history)     denies  Protocols used: Muscle Aches and Body Pain-A-AH

## 2024-02-14 ENCOUNTER — Encounter: Payer: Self-pay | Admitting: Family Medicine

## 2024-02-14 ENCOUNTER — Ambulatory Visit: Admitting: Family Medicine

## 2024-02-14 VITALS — BP 126/70 | HR 87 | Wt 158.0 lb

## 2024-02-14 DIAGNOSIS — W57XXXA Bitten or stung by nonvenomous insect and other nonvenomous arthropods, initial encounter: Secondary | ICD-10-CM

## 2024-02-14 DIAGNOSIS — B349 Viral infection, unspecified: Secondary | ICD-10-CM | POA: Diagnosis not present

## 2024-02-14 DIAGNOSIS — M791 Myalgia, unspecified site: Secondary | ICD-10-CM

## 2024-02-14 DIAGNOSIS — S1086XA Insect bite of other specified part of neck, initial encounter: Secondary | ICD-10-CM | POA: Diagnosis not present

## 2024-02-14 MED ORDER — PREDNISONE 20 MG PO TABS: 40.0000 mg | ORAL_TABLET | Freq: Every day | ORAL | 0 refills | Status: AC

## 2024-02-14 MED ORDER — DOXYCYCLINE HYCLATE 100 MG PO TABS: 200.0000 mg | ORAL_TABLET | Freq: Every day | ORAL | 0 refills | Status: DC

## 2024-02-15 NOTE — Progress Notes (Signed)
   Name: Yuko Coventry Lannette Browning   Date of Visit: 02/15/24   Date of last visit with me: Visit date not found   CHIEF COMPLAINT:  Chief Complaint  Patient presents with   Acute Visit    Pain and tick bite. Pain is in legs, started on Sunday.        HPI:  Discussed the use of AI scribe software for clinical note transcription with the patient, who gave verbal consent to proceed.  History of Present Illness Deysha Cartier Mailen Newborn is a 63 year old female who presents with severe pain from her hip to her feet and weakness following a recent cold.  She experienced a common cold starting last week, with mild mucus production and a sore throat. By Saturday, she felt slightly better, but on Sunday, she awoke with severe pain radiating from her hip down to her feet, accompanied by significant weakness and difficulty getting up. The pain is described as very strong, originating in the lower back and extending to the foot. No previous similar pain has been experienced.  In addition to the pain, she feels very weak and fatigued. She also experiences profuse night sweats and feels warm, although she did not measure her temperature.  She takes metformin  for diabetes, atorvastatin  for cholesterol, and vitamin D . She initially mentioned taking three medications but later clarified that vitamin D  is not considered a medication.  Approximately two weeks ago, she found a tick on her chest while bathing, which she removed herself. Since then, she has noticed red spots on her face, hands, and legs. These spots do not itch or hurt. She recalls being outside cutting her yard, which is near [place] area, around the time she found the tick.     OBJECTIVE:       12/21/2023   11:00 AM  Depression screen PHQ 2/9  Decreased Interest 0  Down, Depressed, Hopeless 0  PHQ - 2 Score 0     BP Readings from Last 3 Encounters:  02/14/24 126/70  12/21/23 110/68  09/17/23 108/68    BP 126/70    Pulse 87   Wt 158 lb (71.7 kg)   LMP 06/30/2015   SpO2 98%   BMI 28.90 kg/m    Physical Exam    Physical Exam  ASSESSMENT/PLAN:   Assessment & Plan Viral illness  Muscle ache  Tick bite of other part of neck, initial encounter    Assessment and Plan Assessment & Plan Acute post-viral myalgia with lower back and leg pain Severe pain from hip to feet post-cold, likely immune response causing transient muscular pain. Differential includes muscular or nerve issue, but timing suggests post-viral myalgia. - Prescribed short course of steroids to address immune response and alleviate pain. - Advised taking steroid in the morning to minimize sleep disturbances. - Instructed to call if no improvement by next Thursday.  Cutaneous eruption (red spots on face, hands, legs) Red spots on face, hands, and legs, non-itchy and non-painful. Possibly related to tick bite or post-viral reaction.  Tick bite, status post removal, with prophylactic antibiotic Tick bite on chest, removed two weeks ago. Low risk of Lyme disease or [place] spotted fever due to short attachment time. - Prescribed single dose of doxycycline as prophylaxis against tick-borne diseases.     Koleman Marling A. Vita MD [facility]

## 2024-04-09 ENCOUNTER — Other Ambulatory Visit: Payer: Self-pay

## 2024-04-09 ENCOUNTER — Emergency Department (HOSPITAL_COMMUNITY)

## 2024-04-09 ENCOUNTER — Emergency Department (HOSPITAL_COMMUNITY)
Admission: EM | Admit: 2024-04-09 | Discharge: 2024-04-09 | Disposition: A | Discharge status: Home / Self Care | Attending: Student in an Organized Health Care Education/Training Program | Admitting: Student in an Organized Health Care Education/Training Program

## 2024-04-09 DIAGNOSIS — M545 Low back pain, unspecified: Secondary | ICD-10-CM | POA: Insufficient documentation

## 2024-04-09 DIAGNOSIS — Z7984 Long term (current) use of oral hypoglycemic drugs: Secondary | ICD-10-CM | POA: Insufficient documentation

## 2024-04-09 DIAGNOSIS — R0789 Other chest pain: Secondary | ICD-10-CM | POA: Diagnosis present

## 2024-04-09 DIAGNOSIS — F419 Anxiety disorder, unspecified: Secondary | ICD-10-CM | POA: Diagnosis not present

## 2024-04-09 DIAGNOSIS — G8929 Other chronic pain: Secondary | ICD-10-CM | POA: Diagnosis not present

## 2024-04-09 DIAGNOSIS — R1011 Right upper quadrant pain: Secondary | ICD-10-CM | POA: Insufficient documentation

## 2024-04-09 DIAGNOSIS — Z79899 Other long term (current) drug therapy: Secondary | ICD-10-CM | POA: Insufficient documentation

## 2024-04-09 DIAGNOSIS — Y92410 Unspecified street and highway as the place of occurrence of the external cause: Secondary | ICD-10-CM | POA: Insufficient documentation

## 2024-04-09 DIAGNOSIS — R079 Chest pain, unspecified: Secondary | ICD-10-CM

## 2024-04-09 DIAGNOSIS — M25511 Pain in right shoulder: Secondary | ICD-10-CM | POA: Diagnosis not present

## 2024-04-09 DIAGNOSIS — E119 Type 2 diabetes mellitus without complications: Secondary | ICD-10-CM | POA: Insufficient documentation

## 2024-04-09 LAB — COMPREHENSIVE METABOLIC PANEL WITH GFR
ALT: 35 U/L (ref 0–44)
AST: 28 U/L (ref 15–41)
Albumin: 4.1 g/dL (ref 3.5–5.0)
Alkaline Phosphatase: 64 U/L (ref 38–126)
Anion gap: 13 (ref 5–15)
BUN: 17 mg/dL (ref 8–23)
CO2: 24 mmol/L (ref 22–32)
Calcium: 9.9 mg/dL (ref 8.9–10.3)
Chloride: 101 mmol/L (ref 98–111)
Creatinine, Ser: 0.67 mg/dL (ref 0.44–1.00)
GFR, Estimated: >60 mL/min (ref >60)
Glucose, Bld: 174 mg/dL — ABNORMAL HIGH (ref 70–99)
Potassium: 4 mmol/L (ref 3.5–5.1)
Sodium: 138 mmol/L (ref 135–145)
Total Bilirubin: 0.7 mg/dL (ref 0.0–1.2)
Total Protein: 7.1 g/dL (ref 6.5–8.1)

## 2024-04-09 LAB — URINALYSIS, ROUTINE W REFLEX MICROSCOPIC
Bilirubin Urine: NEGATIVE
Glucose, UA: NEGATIVE mg/dL
Hgb urine dipstick: NEGATIVE
Ketones, ur: NEGATIVE mg/dL
Leukocytes,Ua: NEGATIVE
Nitrite: NEGATIVE
Protein, ur: 30 mg/dL — AB
Specific Gravity, Urine: 1.013 (ref 1.005–1.030)
pH: 5 (ref 5.0–8.0)

## 2024-04-09 LAB — CBC
HCT: 42.1 % (ref 36.0–46.0)
Hemoglobin: 14.8 g/dL (ref 12.0–15.0)
MCH: 29.8 pg (ref 26.0–34.0)
MCHC: 35.2 g/dL (ref 30.0–36.0)
MCV: 84.7 fL (ref 80.0–100.0)
Platelets: 261 K/uL (ref 150–400)
RBC: 4.97 MIL/uL (ref 3.87–5.11)
RDW: 13.2 % (ref 11.5–15.5)
WBC: 4.8 K/uL (ref 4.0–10.5)
nRBC: 0 % (ref 0.0–0.2)

## 2024-04-09 LAB — TROPONIN I (HIGH SENSITIVITY): Troponin I (High Sensitivity): 3 ng/L (ref <18)

## 2024-04-09 MED ORDER — MORPHINE SULFATE (PF) 2 MG/ML IV SOLN
2.0000 mg | Freq: Once | INTRAVENOUS | Status: AC
  Administered 2024-04-09: 2 mg via INTRAVENOUS
  Filled 2024-04-09: qty 1

## 2024-04-09 MED ORDER — CYCLOBENZAPRINE HCL 10 MG PO TABS: 10.0000 mg | ORAL_TABLET | Freq: Two times a day (BID) | ORAL | 0 refills | Status: DC | PRN

## 2024-04-09 MED ORDER — KETOROLAC TROMETHAMINE 15 MG/ML IJ SOLN
15.0000 mg | Freq: Once | INTRAMUSCULAR | Status: AC
Start: 2024-04-09 — End: 2024-04-09
  Administered 2024-04-09: 15 mg via INTRAVENOUS
  Filled 2024-04-09: qty 1

## 2024-04-09 NOTE — ED Notes (Signed)
 XR at bedside
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
## 2024-04-09 NOTE — ED Notes (Signed)
 Patient discharged by RN. Patient verbalizes understanding of instructions. Patient ambulatory at time of discharge.
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
## 2024-04-16 ENCOUNTER — Ambulatory Visit: Payer: Self-pay
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
## 2024-04-16 NOTE — Telephone Encounter (Signed)
 Pt needs an appt
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
## 2024-04-16 NOTE — Telephone Encounter (Signed)
 Called [facility] and advised Traci Young of patient's symptoms and refusing the ER at this time.

## 2024-04-17 ENCOUNTER — Other Ambulatory Visit: Payer: Self-pay | Admitting: Internal Medicine

## 2024-04-17 ENCOUNTER — Encounter: Payer: Self-pay | Admitting: Family Medicine

## 2024-04-17 ENCOUNTER — Ambulatory Visit: Admitting: Family Medicine

## 2024-04-17 VITALS — BP 120/70 | HR 84 | Ht 62.0 in | Wt 164.4 lb

## 2024-04-17 DIAGNOSIS — Z23 Encounter for immunization: Secondary | ICD-10-CM | POA: Diagnosis not present

## 2024-04-17 DIAGNOSIS — R0789 Other chest pain: Secondary | ICD-10-CM

## 2024-04-17 MED ORDER — VITAMIN D (ERGOCALCIFEROL) 1.25 MG (50000 UNIT) PO CAPS: 50000.0000 [IU] | ORAL_CAPSULE | ORAL | 0 refills | Status: DC

## 2024-04-17 MED ORDER — MELOXICAM 15 MG PO TABS: 15.0000 mg | ORAL_TABLET | Freq: Every day | ORAL | 0 refills | Status: DC

## 2024-04-17 MED ORDER — KETOROLAC TROMETHAMINE 60 MG/2ML IM SOLN
60.0000 mg | Freq: Once | INTRAMUSCULAR | Status: AC
  Administered 2024-04-17: 60 mg via INTRAMUSCULAR

## 2024-04-17 MED ORDER — METFORMIN HCL 500 MG PO TABS: 500.0000 mg | ORAL_TABLET | Freq: Two times a day (BID) | ORAL | 0 refills | Status: DC

## 2024-04-17 MED ORDER — ATORVASTATIN CALCIUM 40 MG PO TABS: 40.0000 mg | ORAL_TABLET | Freq: Every day | ORAL | 0 refills | Status: DC

## 2024-04-17 NOTE — Patient Instructions (Addendum)
 Take meloxicam  once daily with food, until pain has resolved. Wait 6 hours from today's Toradol  injection before taking this medication (take tonight with dinner).  You may continue to use Tylenol  and flexeril , if needed. You can try SalonPas patches with lidocaine . Heating pad may also help.  Return if there is fever, shortness of breath.  Avoid taking any ibuprofen/motrin/advil/aleve.

## 2024-04-17 NOTE — Telephone Encounter (Signed)
 Pt would like a refill on her Vitamin D , metformin  and atorvastatin . I can refill the metformin  and atorvastatin  for 30 days as she is due for a visit and will schedule but wasn't sure if you were ok with refilling the vitamin D 

## 2024-04-19 ENCOUNTER — Emergency Department (HOSPITAL_COMMUNITY)

## 2024-04-19 ENCOUNTER — Other Ambulatory Visit: Payer: Self-pay

## 2024-04-19 ENCOUNTER — Encounter (HOSPITAL_COMMUNITY): Payer: Self-pay

## 2024-04-19 ENCOUNTER — Emergency Department (HOSPITAL_COMMUNITY)
Admission: EM | Admit: 2024-04-19 | Discharge: 2024-04-19 | Disposition: A | Discharge status: Home / Self Care | Attending: Emergency Medicine | Admitting: Emergency Medicine

## 2024-04-19 DIAGNOSIS — E119 Type 2 diabetes mellitus without complications: Secondary | ICD-10-CM | POA: Insufficient documentation

## 2024-04-19 DIAGNOSIS — Z7984 Long term (current) use of oral hypoglycemic drugs: Secondary | ICD-10-CM | POA: Insufficient documentation

## 2024-04-19 DIAGNOSIS — R0789 Other chest pain: Secondary | ICD-10-CM | POA: Insufficient documentation

## 2024-04-19 DIAGNOSIS — R059 Cough, unspecified: Secondary | ICD-10-CM | POA: Insufficient documentation

## 2024-04-19 DIAGNOSIS — Y92410 Unspecified street and highway as the place of occurrence of the external cause: Secondary | ICD-10-CM | POA: Insufficient documentation

## 2024-04-19 LAB — BASIC METABOLIC PANEL WITH GFR
Anion gap: 13 (ref 5–15)
BUN: 17 mg/dL (ref 8–23)
CO2: 22 mmol/L (ref 22–32)
Calcium: 9.5 mg/dL (ref 8.9–10.3)
Chloride: 102 mmol/L (ref 98–111)
Creatinine, Ser: 0.44 mg/dL (ref 0.44–1.00)
GFR, Estimated: >60 mL/min (ref >60)
Glucose, Bld: 121 mg/dL — ABNORMAL HIGH (ref 70–99)
Potassium: 3.9 mmol/L (ref 3.5–5.1)
Sodium: 137 mmol/L (ref 135–145)

## 2024-04-19 LAB — CBC
HCT: 44.5 % (ref 36.0–46.0)
Hemoglobin: 15.4 g/dL — ABNORMAL HIGH (ref 12.0–15.0)
MCH: 29.6 pg (ref 26.0–34.0)
MCHC: 34.6 g/dL (ref 30.0–36.0)
MCV: 85.4 fL (ref 80.0–100.0)
Platelets: 273 K/uL (ref 150–400)
RBC: 5.21 MIL/uL — ABNORMAL HIGH (ref 3.87–5.11)
RDW: 12.6 % (ref 11.5–15.5)
WBC: 4.9 K/uL (ref 4.0–10.5)
nRBC: 0 % (ref 0.0–0.2)

## 2024-04-19 LAB — TROPONIN I (HIGH SENSITIVITY)
Troponin I (High Sensitivity): 3 ng/L (ref <18)
Troponin I (High Sensitivity): 4 ng/L (ref <18)

## 2024-04-19 LAB — RESP PANEL BY RT-PCR (RSV, FLU A&B, COVID)  RVPGX2
Influenza A by PCR: NEGATIVE
Influenza B by PCR: NEGATIVE
Resp Syncytial Virus by PCR: NEGATIVE
SARS Coronavirus 2 by RT PCR: NEGATIVE

## 2024-04-19 MED ORDER — ACETAMINOPHEN 325 MG PO TABS
975.0000 mg | ORAL_TABLET | Freq: Once | ORAL | Status: AC
  Administered 2024-04-19: 975 mg via ORAL
  Filled 2024-04-19: qty 3
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
## 2024-04-19 NOTE — ED Notes (Signed)
 Patient transported to X-ray
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
## 2024-05-12 ENCOUNTER — Ambulatory Visit: Admitting: Medical

## 2024-05-12 VITALS — BP 120/80 | HR 85 | Wt 161.2 lb

## 2024-05-12 DIAGNOSIS — M25551 Pain in right hip: Secondary | ICD-10-CM | POA: Diagnosis not present

## 2024-05-12 DIAGNOSIS — M25561 Pain in right knee: Secondary | ICD-10-CM | POA: Diagnosis not present

## 2024-05-12 DIAGNOSIS — E1169 Type 2 diabetes mellitus with other specified complication: Secondary | ICD-10-CM

## 2024-05-12 DIAGNOSIS — Z603 Acculturation difficulty: Secondary | ICD-10-CM

## 2024-05-12 DIAGNOSIS — E785 Hyperlipidemia, unspecified: Secondary | ICD-10-CM | POA: Diagnosis not present

## 2024-05-12 DIAGNOSIS — E118 Type 2 diabetes mellitus with unspecified complications: Secondary | ICD-10-CM

## 2024-05-12 DIAGNOSIS — R109 Unspecified abdominal pain: Secondary | ICD-10-CM | POA: Diagnosis not present

## 2024-05-12 DIAGNOSIS — Z758 Other problems related to medical facilities and other health care: Secondary | ICD-10-CM | POA: Diagnosis not present

## 2024-05-12 LAB — HEPATIC FUNCTION PANEL
ALT: 59 IU/L — ABNORMAL HIGH (ref 0–32)
AST: 50 IU/L — ABNORMAL HIGH (ref 0–40)
Albumin: 4.8 g/dL (ref 3.9–4.9)
Alkaline Phosphatase: 78 IU/L (ref 49–135)
Bilirubin Total: 0.4 mg/dL (ref 0.0–1.2)
Bilirubin, Direct: 0.12 mg/dL (ref 0.00–0.40)
Total Protein: 7.3 g/dL (ref 6.0–8.5)

## 2024-05-12 LAB — LIPID PANEL
Chol/HDL Ratio: 3.3 ratio (ref 0.0–4.4)
Cholesterol, Total: 211 mg/dL — ABNORMAL HIGH (ref 100–199)
HDL: 63 mg/dL
LDL Chol Calc (NIH): 125 mg/dL — ABNORMAL HIGH (ref 0–99)
Triglycerides: 129 mg/dL (ref 0–149)
VLDL Cholesterol Cal: 23 mg/dL (ref 5–40)

## 2024-05-12 NOTE — Patient Instructions (Signed)
 Right hip and right knee pain Chronic right hip pain with recent exacerbation. Right knee pain worsened, causing sleep disturbances. - Ordered x-ray of right hip and right knee.  Type 2 diabetes mellitus Well-controlled with A1c of 5.7% in August. Blood glucose levels stable. - Continue current diabetes management regimen, Metformin  BID -see eye doctor yearly for diabetic eye exam  Hyperlipidemia Managed. Due for cholesterol test. - Ordered cholesterol test.  Right upper quadrant abdominal pain Intermittent pain with tenderness. Gallbladder has been removed. Possible dietary factors. - Ordered liver function tests. - Advised dietary modifications to avoid gas-producing foods such as pinto beans, onions, and fried foods.  General health maintenance Discussed foot care and footwear. No recent eye examination. - Advised on proper footwear and use of thicker socks for foot protection. - Recommended finding a new eye doctor for routine eye examination
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
## 2024-05-13 ENCOUNTER — Other Ambulatory Visit: Payer: Self-pay | Admitting: Medical

## 2024-05-13 ENCOUNTER — Ambulatory Visit
Admission: RE | Admit: 2024-05-13 | Discharge: 2024-05-13 | Disposition: A | Discharge status: Home / Self Care | Source: Ambulatory Visit | Attending: Medical | Admitting: Medical

## 2024-05-13 ENCOUNTER — Ambulatory Visit: Payer: Self-pay | Admitting: Medical

## 2024-05-13 DIAGNOSIS — M25551 Pain in right hip: Secondary | ICD-10-CM

## 2024-05-13 DIAGNOSIS — R0789 Other chest pain: Secondary | ICD-10-CM

## 2024-05-13 DIAGNOSIS — R7989 Other specified abnormal findings of blood chemistry: Secondary | ICD-10-CM

## 2024-05-13 DIAGNOSIS — M25561 Pain in right knee: Secondary | ICD-10-CM

## 2024-05-13 DIAGNOSIS — R1011 Right upper quadrant pain: Secondary | ICD-10-CM

## 2024-05-13 MED ORDER — VITAMIN D (ERGOCALCIFEROL) 1.25 MG (50000 UNIT) PO CAPS: 50000.0000 [IU] | ORAL_CAPSULE | ORAL | 2 refills | Status: AC

## 2024-05-13 MED ORDER — METFORMIN HCL 500 MG PO TABS: 500.0000 mg | ORAL_TABLET | Freq: Two times a day (BID) | ORAL | 1 refills | Status: AC

## 2024-05-13 MED ORDER — ATORVASTATIN CALCIUM 40 MG PO TABS: 40.0000 mg | ORAL_TABLET | Freq: Every day | ORAL | 3 refills | Status: AC

## 2024-05-13 MED ORDER — CYCLOBENZAPRINE HCL 10 MG PO TABS: 10.0000 mg | ORAL_TABLET | Freq: Two times a day (BID) | ORAL | 0 refills | Status: AC | PRN

## 2024-05-13 MED ORDER — MELOXICAM 15 MG PO TABS: 15.0000 mg | ORAL_TABLET | Freq: Every day | ORAL | 0 refills | Status: AC | PRN

## 2024-05-13 NOTE — Progress Notes (Signed)
 Traci Young - see if we can add acute hepatitis panel  Results:  Cholesterol not at goal. Make sure you are taking your cholesterol medication daily  Liver tests are a little abnormal.   Given your symptoms yesterday, pain in RUQ, I recommend abdominal ultrasound and possible repeat labs for liver if pain worsens in the next few days.   If severe pain in the next few days, recheck here or emergency dept.     Take the next few days to eat small portions, no greasy, fatty foods, no alcohol, and see if pain subsides.

## 2024-05-14 LAB — ACUTE VIRAL HEPATITIS (HAV, HBV, HCV)
HCV Ab: NONREACTIVE
Hep A IgM: NEGATIVE
Hep B C IgM: NEGATIVE
Hepatitis B Surface Ag: NEGATIVE

## 2024-05-14 LAB — HCV INTERPRETATION

## 2024-05-14 LAB — SPECIMEN STATUS REPORT

## 2024-05-16 ENCOUNTER — Telehealth: Payer: Self-pay | Admitting: Medical

## 2024-05-16 NOTE — Telephone Encounter (Signed)
 Copied from CRM #[id number]. Topic: Referral - Status >> May 14, 2024  3:18 PM Kevelyn M wrote: Reason for CRM: Patient's son calling in because the place where she is to get a sonogram does not take her insurance. She needs to be referred to another Imaging facility.  Call back # [phone]

## 2024-05-19 ENCOUNTER — Ambulatory Visit: Admitting: Medical

## 2024-05-19 DIAGNOSIS — R7989 Other specified abnormal findings of blood chemistry: Secondary | ICD-10-CM

## 2024-05-19 DIAGNOSIS — R1011 Right upper quadrant pain: Secondary | ICD-10-CM

## 2024-05-22 ENCOUNTER — Telehealth: Payer: Self-pay | Admitting: Medical

## 2024-05-22 NOTE — Progress Notes (Signed)
 Your hepatitis labs were negative for hepatitis infection.  Continue plan for ultrasound of abdomen and liver.

## 2024-05-22 NOTE — Telephone Encounter (Signed)
 LVM for pt with [facility] [employer] PI#[id number] Pt has been scheduled for US  on Monday May 27, 2023 at 9:00am at [facility] She cannot have anything to eat or drink after midnight. Please call centralized scheduling to change or cancel at [phone], option 3.

## 2024-05-26 ENCOUNTER — Ambulatory Visit (HOSPITAL_BASED_OUTPATIENT_CLINIC_OR_DEPARTMENT_OTHER)
Admission: RE | Admit: 2024-05-26 | Discharge: 2024-05-26 | Disposition: A | Discharge status: Home / Self Care | Source: Ambulatory Visit | Attending: Medical | Admitting: Medical

## 2024-05-26 DIAGNOSIS — R1011 Right upper quadrant pain: Secondary | ICD-10-CM | POA: Diagnosis present

## 2024-05-26 DIAGNOSIS — R7989 Other specified abnormal findings of blood chemistry: Secondary | ICD-10-CM | POA: Insufficient documentation

## 2024-05-26 NOTE — Progress Notes (Signed)
 Results through MyChart

## 2024-05-27 ENCOUNTER — Ambulatory Visit: Payer: Self-pay | Admitting: Medical

## 2024-05-27 NOTE — Progress Notes (Signed)
 Results through MyChart

## 2024-05-29 ENCOUNTER — Ambulatory Visit: Admitting: Family Medicine

## 2024-05-29 VITALS — BP 124/76 | HR 89 | Ht 61.0 in | Wt 162.0 lb

## 2024-05-29 DIAGNOSIS — M25561 Pain in right knee: Secondary | ICD-10-CM

## 2024-05-29 NOTE — Progress Notes (Signed)
" ° °  Name: Traci Young   Date of Visit: 05/29/24   Date of last visit with me: 02/14/2024   CHIEF COMPLAINT:  Chief Complaint  Patient presents with   other    Arthritis in rt knee for 3 weeks       HPI:  Discussed the use of AI scribe software for clinical note transcription with the patient, who gave verbal consent to proceed.  History of Present Illness   Traci Young is a 64 year old female with knee arthritis who presents with knee pain.  She has been experiencing knee pain, specifically on the posterior aspect of her knee, for over two months. The pain began before her last visit, and she was prescribed meloxicam  15 mg, which provided only minimal relief.  The pain significantly impacts her daily activities, particularly when touching the back of her knee. She has been informed that there is fluid in her knee.  She has a history of arthritis in her knee.         OBJECTIVE:       12/21/2023   11:00 AM  Depression screen PHQ 2/9  Decreased Interest 0  Down, Depressed, Hopeless 0  PHQ - 2 Score 0     BP Readings from Last 3 Encounters:  05/29/24 124/76  05/12/24 120/80  04/19/24 113/73    BP 124/76   Pulse 89   Ht 5' 1 (1.549 m)   Wt 162 lb (73.5 kg)   LMP 06/30/2015   SpO2 99%   BMI 30.61 kg/m    Physical Exam   MUSCULOSKELETAL: Pain on palpation of the posterior knee. Mild effusion noted. TTP over medial joint line>lateral. ROM full.      Physical Exam  ASSESSMENT/PLAN:   Assessment & Plan Right knee pain, unspecified chronicity    Assessment and Plan    Right knee osteoarthritis with effusion Chronic osteoarthritis with effusion in all knee compartments. Pain exacerbated despite meloxicam . - Administered steroid injection to right knee for pain relief. - Referred to physical therapy for knee strengthening. - Advised wearing compression sleeve during ambulation over fifteen minutes. - Discussed process for  temporary handicap placard.       PROCEDURE: Risks and benefits of left knee cortisone injection with ultrasound guidance reviewed. Consent obtained. Time-out completed. Patient prepped and draped in the usual sterile fashion. The area was cleansed with chlorhexidine . Ethyl chloride spray was used to anesthetize the skin.  Using real-time ultrasound guidance, the knee joint was identified and an appropriate injection site was selected. A solution of 3 mL of 1% lidocaine  was injected into the superolateral aspect of the left knee for local anesthesia.  After ensuring adequate anesthesia, a 22-gauge 1.5-inch needle was advanced into the left knee joint via a superolateral approach under continuous ultrasound visualization. A solution of 4 mL of 1% lidocaine  with 2 mL of methylprednisolone  (Depo-Medrol ) 40 mg/mL was injected intra-articularly without complication.  The patient tolerated the procedure well with no immediate complications. The area was covered with an adhesive bandage. Post-procedure care and return precautions were reviewed. All questions were answered.   [name] A. Vita MD [facility] "

## 2024-05-30 DIAGNOSIS — M25561 Pain in right knee: Secondary | ICD-10-CM | POA: Diagnosis not present

## 2024-05-30 MED ORDER — BUPIVACAINE HCL 0.25 % IJ SOLN
2.0000 mL | Freq: Once | INTRAMUSCULAR | Status: AC
  Administered 2024-05-30: 2 mL

## 2024-05-30 MED ORDER — METHYLPREDNISOLONE ACETATE 40 MG/ML IJ SUSP
40.0000 mg | Freq: Once | INTRAMUSCULAR | Status: AC
  Administered 2024-05-30: 40 mg via INTRAMUSCULAR

## 2024-05-30 MED ORDER — LIDOCAINE HCL 1 % IJ SOLN
5.0000 mL | Freq: Once | INTRAMUSCULAR | Status: AC
  Administered 2024-05-30: 5 mL via INTRADERMAL

## 2024-05-30 NOTE — Addendum Note (Signed)
 Addended by: LATTIE CARLO BROCKS on: 05/30/2024 07:57 AM   Modules accepted: Orders

## 2024-06-18 ENCOUNTER — Ambulatory Visit: Admitting: Physical Therapy

## 2024-06-18 ENCOUNTER — Other Ambulatory Visit: Payer: Self-pay

## 2024-06-18 ENCOUNTER — Telehealth: Payer: Self-pay | Admitting: Family Medicine

## 2024-06-18 DIAGNOSIS — M25561 Pain in right knee: Secondary | ICD-10-CM

## 2024-06-18 DIAGNOSIS — R2689 Other abnormalities of gait and mobility: Secondary | ICD-10-CM

## 2024-06-18 DIAGNOSIS — M6281 Muscle weakness (generalized): Secondary | ICD-10-CM

## 2024-06-18 DIAGNOSIS — M25661 Stiffness of right knee, not elsewhere classified: Secondary | ICD-10-CM

## 2024-06-18 NOTE — Telephone Encounter (Signed)
 Called pt, advised Hand[employer] form ready, she will pick up here.

## 2024-06-18 NOTE — Therapy (Signed)
 " OUTPATIENT PHYSICAL THERAPY LOWER EXTREMITY EVALUATION    Patient Name: Traci Young MRN: [id number] DOB:1960/10/31, 64 y.o., female Today's Date: 06/18/2024  END OF SESSION:  PT End of Session - 06/18/24 1602     Visit Number 1    Number of Visits 8    Date for Recertification  08/13/24    Authorization Type [employer]    Authorization Time Period Auth requested 06/18/24    PT Start Time 1602    PT Stop Time 1645    PT Time Calculation (min) 43 min    Activity Tolerance Patient tolerated treatment well          Past Medical History:  Diagnosis Date   CHEST PAIN 03/15/2009   Qualifier: Diagnosis of  By: Fernande, MD, CODY Elspeth Darner    Diabetes mellitus, new onset (HCC) 05/19/2019   Hgb A1c >14%   Elevated lipids    no meds, diet  controlled   Gum inflammation 04/05/2021   HYPERCHOLESTEROLEMIA 03/15/2009   Qualifier: Diagnosis of  By: Fernande, MD, CODY Elspeth Darner    Obesity    Osteopenia determined by x-ray 09/01/2020   OVERWEIGHT 03/15/2009   Qualifier: Diagnosis of  By: Fernande, MD, CODY Elspeth Darner    SVD (spontaneous vaginal delivery)    x 4   Vitamin deficiency 04/05/2021   Past Surgical History:  Procedure Laterality Date   CHOLECYSTECTOMY N/A 03/04/2021   Procedure: LAPAROSCOPIC CHOLECYSTECTOMY;  Surgeon: Rubin Calamity, MD;  Location: [facility] OR;  Service: General;  Laterality: N/A;   ENDOVENOUS ABLATION SAPHENOUS VEIN W/ LASER Right 04/24/2018   endovenous laser ablation right greater saphenous vein and stab phlebectomy > 20 incisions right leg by Carlin Haddock MD    SHOULDER SURGERY Right    TUBAL LIGATION     Patient Active Problem List   Diagnosis Date Noted   Encounter for screening mammogram for malignant neoplasm of breast 07/05/2022   Cortical age-related cataract of both eyes 04/21/2022   Nuclear sclerotic cataract of both eyes 04/21/2022   Myopia with astigmatism and presbyopia, bilateral 04/21/2022   Body mass index (BMI)  31.0-31.9, adult 07/25/2021   Vitamin D  deficiency 07/25/2021   Dental plaque 04/05/2021   Chronic right shoulder pain 01/25/2021   History of shoulder surgery 01/25/2021   Limited range of motion (ROM) of shoulder 01/25/2021   Osteopenia determined by x-ray 09/01/2020   Hyperlipidemia associated with type 2 diabetes mellitus (HCC) 04/05/2020   Changes in vision 06/03/2019   Elevated ALT measurement 05/19/2019   Abnormal appearance of cervix 05/19/2019   Language barrier 05/19/2019   BRADYCARDIA 03/15/2009    PCP: Bulah Alm RAMAN, PA-C  REFERRING PROVIDER: Vita Morrow, MD  REFERRING DIAG: [id number] (ICD-10-CM) - Right knee pain, unspecified chronicity  THERAPY DIAG:  Acute pain of right knee  Stiffness of right knee, not elsewhere classified  Muscle weakness (generalized)  Other abnormalities of gait and mobility  Rationale for Evaluation and Treatment: Rehabilitation  ONSET DATE: ~1.5 month ago  SUBJECTIVE:   SUBJECTIVE STATEMENT: Pt states doctor mentioned she could have knee arthritis. Pain started in the back of the R knee and then wrapped around to the front. No injury noted. Insidious onset. Pt states she first felt it when she was sitting for a long period and then came up to stand. Pain is better now after getting an injection from the doctor. Pain is less in the back and more along the sides of the knee now.   PERTINENT  HISTORY: N/a  PAIN:  Are you having pain? Yes: NPRS scale: 3 or 4/10 at rest, at worst 8 or 9/10 Pain location: sides of R knee Pain description: Constant pain Aggravating factors: Sitting, standing, initial stand after resting  Relieving factors: Walking  PRECAUTIONS: None  RED FLAGS: None   WEIGHT BEARING RESTRICTIONS: No  FALLS:  Has patient fallen in last 6 months? 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  DIAGNOSTIC FINDINGS:  05/13/24 Knee x-ray IMPRESSION: Mild tricompartmental osteoarthritis.  PATIENT SURVEYS:  PSFS: THE PATIENT SPECIFIC FUNCTIONAL SCALE  Place score of 0-10 (0 = unable to perform activity and 10 = able to perform activity at the same level as before injury or problem)  Activity Date: 06/18/24    Standing up from sitting 5    2. Standing on feet 5    3.     4.      Average Score 5      Total Score = Sum of activity scores/number of activities  Minimally Detectable Change: 3 points (for single activity); 2 points (for average score)  [facility] (nd). The Patient Specific Functional Scale . Retrieved from [email or web address]   COGNITION: Overall cognitive status: Within functional limits for tasks assessed     SENSATION: WFL  EDEMA:  No more swelling  MUSCLE LENGTH: Hamstrings: Right ~70 deg; Left ~90 deg Thomas test: did not assess  POSTURE: No Significant postural limitations  PALPATION: No significant TTP in muscles...mostly along tibiofemoral joint line  LOWER EXTREMITY ROM:  Active ROM Right eval Left eval  Hip flexion    Hip extension    Hip abduction    Hip adduction    Hip internal rotation    Hip external rotation    Knee flexion 125 125  Knee extension -7 -2  Ankle dorsiflexion    Ankle plantarflexion    Ankle inversion    Ankle eversion     (Blank rows = not tested)  LOWER EXTREMITY MMT:  MMT Right eval Left eval  Hip flexion 3+ 3+  Hip extension 3+ 4  Hip abduction 3+ 4-  Hip adduction 4- 4  Hip internal rotation    Hip external rotation    Knee flexion 5 5  Knee extension 3+ 4  Ankle dorsiflexion    Ankle plantarflexion    Ankle inversion    Ankle eversion      (Blank rows = not tested)  LOWER EXTREMITY SPECIAL TESTS:  Did not assess  FUNCTIONAL TESTS:  SLS: 9.78 sec on L, 9.17 sec on R  GAIT: Distance walked: Into clinic Assistive device utilized: None Level of assistance: Complete Independence Comments: Normal reciprocal pattern  TREATMENT DATE: 06/18/24 See HEP below for trial sets    PATIENT EDUCATION:  Education details: Exam findings, POC, initial HEP Person educated: Patient Education method: Explanation, Demonstration, and Handouts Education comprehension: verbalized understanding, returned demonstration, and needs further education  HOME EXERCISE PROGRAM: Access Code: [email or web address] URL: [email or web address] Date: 06/18/2024 Prepared by: Alejandrina Raimer April Earnie Starring  Exercises - Active Straight Leg Raise with Quad Set  - 2 x daily - 7 x weekly - 1 sets - 10 reps - 3 sec hold - Straight Leg Raise with External Rotation  - 2 x daily - 7 x weekly - 1 sets - 10 reps - 3 sec hold - Supine Bridge  - 2 x daily - 7 x weekly - 1 sets - 10 reps - 3 sec hold - Sidelying Hip Abduction  - 2 x daily - 7 x weekly - 1 sets - 10 reps - 3 sec hold  ASSESSMENT:  CLINICAL IMPRESSION: Patient is a 64 y.o. F who was seen today for physical therapy evaluation and treatment for R knee pain. Assessment is significant for gross R>L LE weakness and decreased ROM. Pt will benefit from PT to address these issues and maximize her level of function for daily tasks.   OBJECTIVE IMPAIRMENTS: decreased activity tolerance, decreased endurance, decreased mobility, decreased ROM, decreased strength, improper body mechanics, and pain.   ACTIVITY LIMITATIONS: standing, transfers, and locomotion level  PARTICIPATION LIMITATIONS: cleaning, driving, and community activity  PERSONAL FACTORS: Age, Fitness, Past/current experiences,  and Time since onset of injury/illness/exacerbation are also affecting patient's functional outcome.   REHAB POTENTIAL: Good  CLINICAL DECISION MAKING: Evolving/moderate complexity  EVALUATION COMPLEXITY: Moderate   GOALS: Goals reviewed with patient? Yes  SHORT TERM GOALS: Target date: 07/09/2024  Pt will be ind with initial HEP Baseline: Goal status: INITIAL  2.  Pt will demo L = R knee ROM without pain Baseline:  Goal status: INITIAL  LONG TERM GOALS: Target date: 07/30/2024   Pt will be ind with management and progression of HEP Baseline:  Goal status: INITIAL  2.  Pt will have improved PSFS average score to >/=7 Baseline:  Goal status: INITIAL  3.  Pt will report improved pain by >/=75% with all activities Baseline:  Goal status: INITIAL  4.  Pt will demo R = L LE MMT for increased R knee stability with standing tasks Baseline:  Goal status: INITIAL  PLAN:  PT FREQUENCY: 1x/week  PT DURATION: 8 weeks  PLANNED INTERVENTIONS: 97164- PT Re-evaluation, 97750- Physical Performance Testing, 97110-Therapeutic exercises, 97530- Therapeutic activity, [id number]- Neuromuscular re-education, 97535- Self Care, [id number]- Manual therapy, [id number]- Gait training, [phone]- Aquatic Therapy, [phone]- Electrical stimulation (unattended), 97016- Vasopneumatic device, [id number]- Ultrasound, [id number]- Ionotophoresis 4mg /ml Dexamethasone , 79439 (1-2 muscles), [id number] (3+ muscles)- Dry Needling, Patient/Family education, Balance training, Stair training, Taping, Joint mobilization, Cryotherapy, and Moist heat  PLAN FOR NEXT SESSION: Assess response to HEP. Focus on R quad strengthening and gross hip strengthening. Quad/hamstring stretching. Initiate leg press to work on improving her sit<>stand transfers with less pain   Diahann Guajardo April Ma L Deija Buhrman, PT, DPT 06/18/2024, 5:02 PM  "

## 2024-06-25 ENCOUNTER — Encounter

## 2024-07-29 ENCOUNTER — Ambulatory Visit: Admitting: Family Medicine

## 2024-11-11 ENCOUNTER — Ambulatory Visit: Admitting: Medical
